# Patient Record
Sex: Female | Born: 1940
Health system: Southern US, Community
[De-identification: ages and names within clinical notes are randomized; demographics above are authoritative.]

## PROBLEM LIST (undated history)

## (undated) DIAGNOSIS — E7800 Pure hypercholesterolemia, unspecified: Secondary | ICD-10-CM

## (undated) DIAGNOSIS — I10 Essential (primary) hypertension: Secondary | ICD-10-CM

## (undated) DIAGNOSIS — E119 Type 2 diabetes mellitus without complications: Secondary | ICD-10-CM

## (undated) DIAGNOSIS — J45909 Unspecified asthma, uncomplicated: Secondary | ICD-10-CM

## (undated) DIAGNOSIS — G459 Transient cerebral ischemic attack, unspecified: Secondary | ICD-10-CM

## (undated) HISTORY — PX: ABDOMINAL HYSTERECTOMY: SHX81

## (undated) HISTORY — PX: CHOLECYSTECTOMY: SHX55

## (undated) HISTORY — PX: APPENDECTOMY: SHX54

---

## 2011-06-05 ENCOUNTER — Encounter (INDEPENDENT_AMBULATORY_CARE_PROVIDER_SITE_OTHER): Payer: Medicare Other | Admitting: Ophthalmology

## 2011-06-05 DIAGNOSIS — E11319 Type 2 diabetes mellitus with unspecified diabetic retinopathy without macular edema: Secondary | ICD-10-CM

## 2011-06-05 DIAGNOSIS — E1139 Type 2 diabetes mellitus with other diabetic ophthalmic complication: Secondary | ICD-10-CM

## 2011-06-05 DIAGNOSIS — H43819 Vitreous degeneration, unspecified eye: Secondary | ICD-10-CM

## 2011-06-05 DIAGNOSIS — H35039 Hypertensive retinopathy, unspecified eye: Secondary | ICD-10-CM

## 2011-06-05 DIAGNOSIS — H2510 Age-related nuclear cataract, unspecified eye: Secondary | ICD-10-CM

## 2011-06-05 DIAGNOSIS — I10 Essential (primary) hypertension: Secondary | ICD-10-CM

## 2011-08-07 ENCOUNTER — Encounter (INDEPENDENT_AMBULATORY_CARE_PROVIDER_SITE_OTHER): Payer: Medicare Other | Admitting: Ophthalmology

## 2011-08-07 DIAGNOSIS — H43819 Vitreous degeneration, unspecified eye: Secondary | ICD-10-CM

## 2011-08-07 DIAGNOSIS — H35039 Hypertensive retinopathy, unspecified eye: Secondary | ICD-10-CM

## 2011-08-07 DIAGNOSIS — E11319 Type 2 diabetes mellitus with unspecified diabetic retinopathy without macular edema: Secondary | ICD-10-CM

## 2011-08-07 DIAGNOSIS — H2510 Age-related nuclear cataract, unspecified eye: Secondary | ICD-10-CM

## 2011-08-07 DIAGNOSIS — E1165 Type 2 diabetes mellitus with hyperglycemia: Secondary | ICD-10-CM

## 2011-08-07 DIAGNOSIS — I10 Essential (primary) hypertension: Secondary | ICD-10-CM

## 2015-04-21 DIAGNOSIS — G459 Transient cerebral ischemic attack, unspecified: Secondary | ICD-10-CM

## 2015-04-21 HISTORY — DX: Transient cerebral ischemic attack, unspecified: G45.9

## 2015-12-17 DIAGNOSIS — Z79899 Other long term (current) drug therapy: Secondary | ICD-10-CM | POA: Insufficient documentation

## 2015-12-17 DIAGNOSIS — I10 Essential (primary) hypertension: Secondary | ICD-10-CM | POA: Insufficient documentation

## 2015-12-17 DIAGNOSIS — E7800 Pure hypercholesterolemia, unspecified: Secondary | ICD-10-CM | POA: Insufficient documentation

## 2016-03-06 DIAGNOSIS — F419 Anxiety disorder, unspecified: Secondary | ICD-10-CM | POA: Insufficient documentation

## 2016-03-06 DIAGNOSIS — F411 Generalized anxiety disorder: Secondary | ICD-10-CM | POA: Insufficient documentation

## 2016-03-06 DIAGNOSIS — R410 Disorientation, unspecified: Secondary | ICD-10-CM | POA: Insufficient documentation

## 2016-03-07 DIAGNOSIS — G459 Transient cerebral ischemic attack, unspecified: Secondary | ICD-10-CM | POA: Insufficient documentation

## 2016-03-07 DIAGNOSIS — D72829 Elevated white blood cell count, unspecified: Secondary | ICD-10-CM | POA: Insufficient documentation

## 2016-03-07 DIAGNOSIS — N390 Urinary tract infection, site not specified: Secondary | ICD-10-CM | POA: Insufficient documentation

## 2016-03-08 DIAGNOSIS — N183 Chronic kidney disease, stage 3 unspecified: Secondary | ICD-10-CM | POA: Insufficient documentation

## 2016-03-08 DIAGNOSIS — F0390 Unspecified dementia without behavioral disturbance: Secondary | ICD-10-CM | POA: Insufficient documentation

## 2016-03-20 DIAGNOSIS — R600 Localized edema: Secondary | ICD-10-CM | POA: Insufficient documentation

## 2016-11-10 DIAGNOSIS — Z0000 Encounter for general adult medical examination without abnormal findings: Secondary | ICD-10-CM | POA: Insufficient documentation

## 2016-12-08 DIAGNOSIS — G4700 Insomnia, unspecified: Secondary | ICD-10-CM | POA: Insufficient documentation

## 2016-12-16 DIAGNOSIS — K529 Noninfective gastroenteritis and colitis, unspecified: Secondary | ICD-10-CM | POA: Insufficient documentation

## 2016-12-16 DIAGNOSIS — K5730 Diverticulosis of large intestine without perforation or abscess without bleeding: Secondary | ICD-10-CM | POA: Insufficient documentation

## 2017-04-06 DIAGNOSIS — E559 Vitamin D deficiency, unspecified: Secondary | ICD-10-CM | POA: Insufficient documentation

## 2018-07-06 ENCOUNTER — Emergency Department (HOSPITAL_BASED_OUTPATIENT_CLINIC_OR_DEPARTMENT_OTHER): Payer: Medicare HMO

## 2018-07-06 ENCOUNTER — Emergency Department (HOSPITAL_BASED_OUTPATIENT_CLINIC_OR_DEPARTMENT_OTHER)
Admission: EM | Admit: 2018-07-06 | Discharge: 2018-07-06 | Disposition: A | Discharge status: Home / Self Care | Payer: Medicare HMO | Attending: Emergency Medicine | Admitting: Emergency Medicine

## 2018-07-06 ENCOUNTER — Other Ambulatory Visit: Payer: Self-pay

## 2018-07-06 ENCOUNTER — Encounter (HOSPITAL_BASED_OUTPATIENT_CLINIC_OR_DEPARTMENT_OTHER): Payer: Self-pay

## 2018-07-06 DIAGNOSIS — S0121XA Laceration without foreign body of nose, initial encounter: Secondary | ICD-10-CM | POA: Insufficient documentation

## 2018-07-06 DIAGNOSIS — Y929 Unspecified place or not applicable: Secondary | ICD-10-CM | POA: Insufficient documentation

## 2018-07-06 DIAGNOSIS — W19XXXA Unspecified fall, initial encounter: Secondary | ICD-10-CM

## 2018-07-06 DIAGNOSIS — S60221A Contusion of right hand, initial encounter: Secondary | ICD-10-CM | POA: Diagnosis not present

## 2018-07-06 DIAGNOSIS — Z79899 Other long term (current) drug therapy: Secondary | ICD-10-CM | POA: Diagnosis not present

## 2018-07-06 DIAGNOSIS — W108XXA Fall (on) (from) other stairs and steps, initial encounter: Secondary | ICD-10-CM | POA: Diagnosis not present

## 2018-07-06 DIAGNOSIS — I10 Essential (primary) hypertension: Secondary | ICD-10-CM | POA: Insufficient documentation

## 2018-07-06 DIAGNOSIS — Z7902 Long term (current) use of antithrombotics/antiplatelets: Secondary | ICD-10-CM | POA: Insufficient documentation

## 2018-07-06 DIAGNOSIS — S5011XA Contusion of right forearm, initial encounter: Secondary | ICD-10-CM

## 2018-07-06 DIAGNOSIS — E119 Type 2 diabetes mellitus without complications: Secondary | ICD-10-CM | POA: Insufficient documentation

## 2018-07-06 DIAGNOSIS — J45909 Unspecified asthma, uncomplicated: Secondary | ICD-10-CM | POA: Insufficient documentation

## 2018-07-06 DIAGNOSIS — S6992XA Unspecified injury of left wrist, hand and finger(s), initial encounter: Secondary | ICD-10-CM | POA: Diagnosis present

## 2018-07-06 DIAGNOSIS — Z8673 Personal history of transient ischemic attack (TIA), and cerebral infarction without residual deficits: Secondary | ICD-10-CM | POA: Insufficient documentation

## 2018-07-06 DIAGNOSIS — S60222A Contusion of left hand, initial encounter: Secondary | ICD-10-CM | POA: Insufficient documentation

## 2018-07-06 DIAGNOSIS — M25572 Pain in left ankle and joints of left foot: Secondary | ICD-10-CM | POA: Insufficient documentation

## 2018-07-06 DIAGNOSIS — Y9389 Activity, other specified: Secondary | ICD-10-CM | POA: Diagnosis not present

## 2018-07-06 DIAGNOSIS — Z7984 Long term (current) use of oral hypoglycemic drugs: Secondary | ICD-10-CM | POA: Diagnosis not present

## 2018-07-06 DIAGNOSIS — Y999 Unspecified external cause status: Secondary | ICD-10-CM | POA: Insufficient documentation

## 2018-07-06 HISTORY — DX: Transient cerebral ischemic attack, unspecified: G45.9

## 2018-07-06 HISTORY — DX: Type 2 diabetes mellitus without complications: E11.9

## 2018-07-06 HISTORY — DX: Unspecified asthma, uncomplicated: J45.909

## 2018-07-06 HISTORY — DX: Essential (primary) hypertension: I10

## 2018-07-06 HISTORY — DX: Pure hypercholesterolemia, unspecified: E78.00

## 2018-07-06 NOTE — Discharge Instructions (Addendum)
Take tylenol for pain control.  Use ice for pain and swelling.  You will likely have increased pain/stiffness/soreness for the next several days.  Follow up with your PCP as scheduled. Return to the ER if you develop severe headache, vision changes, vomiting, weakness/numbness, or any new, worsening, or concerning symptoms.

## 2018-07-06 NOTE — ED Triage Notes (Signed)
Pt was coming down the steps and missed the last step, stumbled down two steps, has bruising and swelling to left ankle, right wrist and states she hit her lower back on a clock as she fell. Occurred approximately 30 minutes ago. Pt is on plavix, did not hit head, but did happen to have a teapot from a shelf fall onto the bridge of her nose this morning, so she has bruising there as well.

## 2018-07-06 NOTE — ED Notes (Addendum)
States that she wants dr to check her head from a previous head injury pain at top of head left wrist swelling and left ankle swelling  Bruise to rt lower arm , she can walk on ankle but it hurts and is starting to feel tight. MAE with purpose, has good neuro no extra  numbnesss ,states has neuropathy, or tingling

## 2018-07-06 NOTE — ED Provider Notes (Signed)
[facility] EMERGENCY DEPARTMENT Provider Note   CSN: [id number] Arrival date & time: 07/06/18  1526    History   Chief Complaint Chief Complaint  Patient presents with   Fall    HPI Theresa Allen is a 78 y.o. female presenting for evaluation after fall.  Patient states just prior to arrival she was going down the stairs when she turned around to clean something off the rug, and then slipped on the last several stairs.  She reports hitting her left ankle, bilateral forearms, and back.  She denies hitting her head or loss of consciousness.  She is on Plavix.  Patient has been able to ambulate since, but reports worsening pain with ambulation, especially in her foot.  She has not taken anything for pain including Tylenol or ibuprofen.  Patient states earlier today, a teapot fell of her shelf and hit her on the bridge of her nose.  She denies other recent head trauma.  She denies vision changes, slurred speech, neck pain, numbness, weakness, or tingling.      HPI  Past Medical History:  Diagnosis Date   Asthma    Diabetes mellitus without complication (HCC)    High cholesterol    Hypertension    TIA (transient ischemic attack) 2017    There are no active problems to display for this patient.   Past Surgical History:  Procedure Laterality Date   ABDOMINAL HYSTERECTOMY     APPENDECTOMY     CHOLECYSTECTOMY       OB History   No obstetric history on file.      Home Medications    Prior to Admission medications   Medication Sig Start Date End Date Taking? Authorizing Provider  amLODipine (NORVASC) 10 MG tablet TAKE 1 TABLET(10 MG) BY MOUTH DAILY 05/05/16  Yes [provider]  clopidogrel (PLAVIX) 75 MG tablet Take by mouth. 12/08/16  Yes [provider]  donepezil (ARICEPT) 5 MG tablet Take by mouth. 12/08/16  Yes [provider]  gabapentin (NEURONTIN) 100 MG capsule Take 200 mg by mouth 3 (three) times daily.   Yes [provider]  glipiZIDE (GLUCOTROL XL) 10 MG 24 hr tablet TAKE 1 TABLET BY MOUTH TWICE DAILY 09/16/16  Yes [provider]  linagliptin (TRADJENTA) 5 MG TABS tablet Take 5 mg by mouth daily.   Yes [provider]  PARoxetine (PAXIL) 30 MG tablet TAKE 1 TABLET(30 MG) BY MOUTH EVERY MORNING 11/10/16  Yes [provider]  pioglitazone (ACTOS) 30 MG tablet Take by mouth. 12/22/17  Yes [provider]  simvastatin (ZOCOR) 20 MG tablet TAKE 1 TABLET(20 MG) BY MOUTH EVERY NIGHT 09/16/16  Yes [provider]  traZODone (DESYREL) 50 MG tablet Take by mouth. 12/08/16  Yes [provider]  Cholecalciferol (VITAMIN D-1000 MAX ST) 25 MCG (1000 UT) tablet Take by mouth.    [provider]    Family History No family history on file.  Social History Social History   Tobacco Use   Smoking status: Not on file  Substance Use Topics   Alcohol use: Not on file   Drug use: Not on file     Allergies   Metformin   Review of Systems Review of Systems  Musculoskeletal: Positive for arthralgias, back pain and joint swelling.  Hematological: Bruises/bleeds easily.  All other systems reviewed and are negative.    Physical Exam Updated Vital Signs BP (!) 127/55 (BP Location: Right Arm)    Pulse 77  Temp 97.9 F (36.6 C) (Oral)    Resp 16    Ht  (1.575 m)    Wt 90.7 kg    SpO2 97%    BMI 36.58 kg/m   Physical Exam Vitals signs and nursing note reviewed.  Constitutional:      General: She is not in acute distress.    Appearance: She is well-developed.  HENT:     Head: Normocephalic and atraumatic.     Comments: Small, superficial laceration over the bridge of the nose.  No injury noted elsewhere on the face or skull.  No hemotympanum or nasal septal hematoma. Eyes:     Extraocular Movements: Extraocular movements intact.     Conjunctiva/sclera: Conjunctivae normal.     Pupils: Pupils are equal, round, and reactive to light.      Comments: EOMI and PERRLA.  No entrapment.  Neck:     Musculoskeletal: Normal range of motion and neck supple.     Comments: No tenderness palpation of midline C-spine.  Moving head easily without signs of pain. Cardiovascular:     Rate and Rhythm: Normal rate and regular rhythm.     Pulses: Normal pulses.  Pulmonary:     Effort: Pulmonary effort is normal. No respiratory distress.     Breath sounds: Normal breath sounds. No wheezing.  Abdominal:     General: There is no distension.     Palpations: Abdomen is soft. There is no mass.     Tenderness: There is no abdominal tenderness. There is no guarding or rebound.  Musculoskeletal:        General: Swelling, tenderness and signs of injury present.     Comments: Contusion and abrasion of mid back over the midline.  No tenderness palpation elsewhere in the back.  No obvious step-offs.  Strength of upper and lower extremities intact bilaterally.  Sensation intact x4.  No saddle paresthesias. Hematoma of the left hand at the fourth MTP.  Good distal cap refill.  Full active range of motion of the wrist and fingers with minimal pain. Hematoma of the right forearm.  No obvious deformity.  Radial pulse intact.  Full active range of motion of the elbow and wrist without difficulty. Hematoma of the left lateral ankle just proximal to the lateral malleolus.  Pedal pulses intact.  Good distal cap refill.  Skin:    General: Skin is warm and dry.     Capillary Refill: Capillary refill takes less than 2 seconds.  Neurological:     Mental Status: She is alert and oriented to person, place, and time.      ED Treatments / Results  Labs (all labs ordered are listed, but only abnormal results are displayed) Labs Reviewed - No data to display  EKG None  Radiology Dg Thoracic Spine 2 View  Result Date: 07/06/2018 CLINICAL DATA:  Larey Seat today missing last few stairs, landed on mid back, pain at mid lower back and lumbar region, initial encounter  EXAM: THORACIC SPINE 2 VIEWS COMPARISON:  None FINDINGS: Twelve pairs of ribs. Bones demineralized. Mild scattered endplate spur formation at T9-T10 on RIGHT. Vertebral body heights maintained without fracture or subluxation. No bone destruction. IMPRESSION: No acute osseous abnormalities. Electronically Signed   By: Ulyses Southward M.D.   On: 07/06/2018 16:43   Dg Lumbar Spine 2-3 Views  Result Date: 07/06/2018 CLINICAL DATA:  Post fall now with mid and low back pain. EXAM: LUMBAR SPINE - 2-3 VIEW COMPARISON:  Thoracic spine radiographs-07/06/2018 FINDINGS:  There are 5 non rib-bearing lumbar type vertebral bodies. There is minimal (approximately 4 mm) of anterolisthesis of L4 upon L5. Lumbar vertebral body heights appear preserved. Mild to moderate multilevel lumbar spine DDD, worse at L2-L3 with disc space height loss, endplate irregularity and sclerosis. Limited visualization of the bilateral SI joints is normal. Atherosclerotic plaque within the abdominal aorta. Phleboliths overlie the lower pelvis bilaterally. IMPRESSION: 1. No acute findings. 2. Mild-to-moderate multilevel lumbar spine DDD, worse at L2-L3. Electronically Signed   By: Simonne Come M.D.   On: 07/06/2018 16:53   Dg Forearm Right  Result Date: 07/06/2018 CLINICAL DATA:  Fall, pain, bruising and RIGHT swelling where she struck her RIGHT posterior mid forearm EXAM: RIGHT FOREARM - 2 VIEW COMPARISON:  None FINDINGS: Dorsal soft tissue swelling at mid RIGHT forearm. Mild osseous demineralization. Joint spaces preserved. No acute fracture, dislocation, or bone destruction. IMPRESSION: Soft tissue swelling without acute osseous abnormalities. Electronically Signed   By: Ulyses Southward M.D.   On: 07/06/2018 16:42   Dg Ankle Complete Left  Result Date: 07/06/2018 CLINICAL DATA:  Larey Seat today injuring LEFT ankle, pain, swelling and bruising at lateral malleolus EXAM: LEFT ANKLE COMPLETE - 3+ VIEW COMPARISON:  None FINDINGS: Osseous demineralization.  Nonfused ossicle at tip of lateral malleolus, appears corticated and old. Soft tissue swelling laterally and anteriorly at distal LEFT lower leg into LEFT ankle. Joint spaces preserved. No acute fracture, dislocation, or bone destruction. IMPRESSION: No acute osseous abnormalities. Electronically Signed   By: Ulyses Southward M.D.   On: 07/06/2018 16:46   Ct Head Wo Contrast  Result Date: 07/06/2018 CLINICAL DATA:  Larey Seat down steps, hitting his head on a grandfather clock. EXAM: CT HEAD WITHOUT CONTRAST TECHNIQUE: Contiguous axial images were obtained from the base of the skull through the vertex without intravenous contrast. COMPARISON:  None. FINDINGS: Brain: Mild-to-moderate enlargement of the ventricles and subarachnoid spaces. Mild-to-moderate patchy white matter low density in both cerebral hemispheres. No intracranial hemorrhage, mass lesion or CT evidence of acute infarction. Vascular: No hyperdense vessel or unexpected calcification. Skull: Normal. Negative for fracture or focal lesion. Sinuses/Orbits: Status post bilateral cataract extraction. Unremarkable included paranasal sinuses. Other: None. IMPRESSION: 1. No skull fracture or intracranial hemorrhage. 2. Mild to moderate diffuse cerebral and cerebellar atrophy and mild-to-moderate chronic small vessel white matter ischemic changes in both cerebral hemispheres. Electronically Signed   By: Beckie Salts M.D.   On: 07/06/2018 16:19   Dg Hand Complete Left  Result Date: 07/06/2018 CLINICAL DATA:  Larey Seat today striking posterior LEFT hand, pain swelling and bruising at third through fifth metacarpal region EXAM: LEFT HAND - COMPLETE 3+ VIEW COMPARISON:  None FINDINGS: Osseous demineralization. Mild scattered joint space narrowing, greatest at first MCP joint. No acute fracture, dislocation, or bone destruction. Soft tissue swelling overlying the distal metacarpals and MCP joints on lateral view. IMPRESSION: No acute osseous abnormalities. Electronically  Signed   By: Ulyses Southward M.D.   On: 07/06/2018 16:45    Procedures Procedures (including critical care time)  Medications Ordered in ED Medications - No data to display   Initial Impression / Assessment and Plan / ED Course  I have reviewed the triage vital signs and the nursing notes.  Pertinent labs & imaging results that were available during my care of the patient were reviewed by me and considered in my medical decision making (see chart for details).        Patient presenting for evaluation after mechanical fall.  Physical exam  reassuring, she is neurovascularly intact.  However, patient at higher risk due to being on Plavix and due to her age.  Will obtain CT of the head, as well as imaging of the bilateral upper extremities, back, and left lower extremity.  X-rays viewed interpreted by me, no fractures or dislocations.  CT head negative for bleed or swelling.  Case discussed with attending, Dr. Jacqulyn Bath agrees to plan.  Discussed findings with patient.  Discussed Intermatic treatment with ice and Tylenol.  Patient has a appointment with her PCP next week already scheduled for a physical, encourage patient to follow-up as scheduled.  Strict return precautions given, including signs of head injury.  At this time, patient received a discharge.  Patient states she understands and agrees to plan.  Final Clinical Impressions(s) / ED Diagnoses   Final diagnoses:  Fall, initial encounter  Acute left ankle pain  Contusion of left hand, initial encounter  Contusion of right forearm, initial encounter    ED Discharge Orders    None       Alveria Apley, PA-C 07/06/18 1754    Long, Arlyss Repress, MD 07/07/18 [phone]

## 2018-08-24 DIAGNOSIS — H9313 Tinnitus, bilateral: Secondary | ICD-10-CM | POA: Insufficient documentation

## 2018-08-24 DIAGNOSIS — J302 Other seasonal allergic rhinitis: Secondary | ICD-10-CM | POA: Insufficient documentation

## 2018-09-03 DIAGNOSIS — M2041 Other hammer toe(s) (acquired), right foot: Secondary | ICD-10-CM | POA: Insufficient documentation

## 2020-01-03 DIAGNOSIS — E119 Type 2 diabetes mellitus without complications: Secondary | ICD-10-CM | POA: Insufficient documentation

## 2020-01-03 DIAGNOSIS — R0989 Other specified symptoms and signs involving the circulatory and respiratory systems: Secondary | ICD-10-CM | POA: Insufficient documentation

## 2020-06-12 ENCOUNTER — Emergency Department (HOSPITAL_COMMUNITY)
Admission: EM | Admit: 2020-06-12 | Discharge: 2020-06-13 | Disposition: A | Discharge status: Other Acute Inpatient Hospital | Payer: Medicare HMO | Attending: Emergency Medicine | Admitting: Emergency Medicine

## 2020-06-12 ENCOUNTER — Encounter (HOSPITAL_COMMUNITY): Payer: Self-pay

## 2020-06-12 ENCOUNTER — Other Ambulatory Visit: Payer: Self-pay

## 2020-06-12 ENCOUNTER — Emergency Department (HOSPITAL_COMMUNITY): Payer: Medicare HMO

## 2020-06-12 DIAGNOSIS — Z79899 Other long term (current) drug therapy: Secondary | ICD-10-CM | POA: Diagnosis not present

## 2020-06-12 DIAGNOSIS — I10 Essential (primary) hypertension: Secondary | ICD-10-CM | POA: Diagnosis not present

## 2020-06-12 DIAGNOSIS — F339 Major depressive disorder, recurrent, unspecified: Secondary | ICD-10-CM

## 2020-06-12 DIAGNOSIS — F332 Major depressive disorder, recurrent severe without psychotic features: Secondary | ICD-10-CM | POA: Insufficient documentation

## 2020-06-12 DIAGNOSIS — G479 Sleep disorder, unspecified: Secondary | ICD-10-CM | POA: Insufficient documentation

## 2020-06-12 DIAGNOSIS — Z20822 Contact with and (suspected) exposure to covid-19: Secondary | ICD-10-CM | POA: Diagnosis not present

## 2020-06-12 DIAGNOSIS — Z7902 Long term (current) use of antithrombotics/antiplatelets: Secondary | ICD-10-CM | POA: Diagnosis not present

## 2020-06-12 DIAGNOSIS — N390 Urinary tract infection, site not specified: Secondary | ICD-10-CM | POA: Insufficient documentation

## 2020-06-12 DIAGNOSIS — F4321 Adjustment disorder with depressed mood: Secondary | ICD-10-CM | POA: Insufficient documentation

## 2020-06-12 DIAGNOSIS — Z8673 Personal history of transient ischemic attack (TIA), and cerebral infarction without residual deficits: Secondary | ICD-10-CM | POA: Insufficient documentation

## 2020-06-12 DIAGNOSIS — Z7984 Long term (current) use of oral hypoglycemic drugs: Secondary | ICD-10-CM | POA: Diagnosis not present

## 2020-06-12 DIAGNOSIS — J45909 Unspecified asthma, uncomplicated: Secondary | ICD-10-CM | POA: Diagnosis not present

## 2020-06-12 DIAGNOSIS — R45851 Suicidal ideations: Secondary | ICD-10-CM | POA: Insufficient documentation

## 2020-06-12 DIAGNOSIS — E119 Type 2 diabetes mellitus without complications: Secondary | ICD-10-CM | POA: Diagnosis not present

## 2020-06-12 LAB — COMPREHENSIVE METABOLIC PANEL
ALT: 19 U/L (ref 0–44)
AST: 23 U/L (ref 15–41)
Albumin: 4.3 g/dL (ref 3.5–5.0)
Alkaline Phosphatase: 65 U/L (ref 38–126)
Anion gap: 11 (ref 5–15)
BUN: 20 mg/dL (ref 8–23)
CO2: 24 mmol/L (ref 22–32)
Calcium: 9 mg/dL (ref 8.9–10.3)
Chloride: 106 mmol/L (ref 98–111)
Creatinine, Ser: 1.29 mg/dL — ABNORMAL HIGH (ref 0.44–1.00)
GFR, Estimated: 42 mL/min — ABNORMAL LOW (ref >60)
Glucose, Bld: 164 mg/dL — ABNORMAL HIGH (ref 70–99)
Potassium: 3.6 mmol/L (ref 3.5–5.1)
Sodium: 141 mmol/L (ref 135–145)
Total Bilirubin: 0.7 mg/dL (ref 0.3–1.2)
Total Protein: 7.2 g/dL (ref 6.5–8.1)

## 2020-06-12 LAB — CBC WITH DIFFERENTIAL/PLATELET
Abs Immature Granulocytes: 0.02 10*3/uL (ref 0.00–0.07)
Basophils Absolute: 0.1 10*3/uL (ref 0.0–0.1)
Basophils Relative: 1 %
Eosinophils Absolute: 0.1 10*3/uL (ref 0.0–0.5)
Eosinophils Relative: 2 %
HCT: 44.6 % (ref 36.0–46.0)
Hemoglobin: 14.5 g/dL (ref 12.0–15.0)
Immature Granulocytes: 0 %
Lymphocytes Relative: 27 %
Lymphs Abs: 1.7 10*3/uL (ref 0.7–4.0)
MCH: 31.6 pg (ref 26.0–34.0)
MCHC: 32.5 g/dL (ref 30.0–36.0)
MCV: 97.2 fL (ref 80.0–100.0)
Monocytes Absolute: 0.6 10*3/uL (ref 0.1–1.0)
Monocytes Relative: 9 %
Neutro Abs: 3.9 10*3/uL (ref 1.7–7.7)
Neutrophils Relative %: 61 %
Platelets: 228 10*3/uL (ref 150–400)
RBC: 4.59 MIL/uL (ref 3.87–5.11)
RDW: 13.2 % (ref 11.5–15.5)
WBC: 6.4 10*3/uL (ref 4.0–10.5)
nRBC: 0 % (ref 0.0–0.2)

## 2020-06-12 LAB — ACETAMINOPHEN LEVEL: Acetaminophen (Tylenol), Serum: <10 ug/mL — ABNORMAL LOW (ref 10–30)

## 2020-06-12 LAB — URINALYSIS, ROUTINE W REFLEX MICROSCOPIC
Bilirubin Urine: NEGATIVE
Glucose, UA: 50 mg/dL — AB
Hgb urine dipstick: NEGATIVE
Ketones, ur: 5 mg/dL — AB
Nitrite: NEGATIVE
Protein, ur: 30 mg/dL — AB
Specific Gravity, Urine: 1.028 (ref 1.005–1.030)
WBC, UA: >50 WBC/hpf — ABNORMAL HIGH (ref 0–5)
pH: 5 (ref 5.0–8.0)

## 2020-06-12 LAB — CBG MONITORING, ED: Glucose-Capillary: 146 mg/dL — ABNORMAL HIGH (ref 70–99)

## 2020-06-12 LAB — LIPASE, BLOOD: Lipase: 34 U/L (ref 11–51)

## 2020-06-12 LAB — SALICYLATE LEVEL: Salicylate Lvl: <7 mg/dL — ABNORMAL LOW (ref 7.0–30.0)

## 2020-06-12 MED ORDER — SIMVASTATIN 20 MG PO TABS
20.0000 mg | ORAL_TABLET | Freq: Every day | ORAL | Status: DC
Start: 2020-06-13 — End: 2020-06-13

## 2020-06-12 MED ORDER — CEPHALEXIN 500 MG PO CAPS
500.0000 mg | ORAL_CAPSULE | Freq: Three times a day (TID) | ORAL | Status: DC
  Administered 2020-06-12 – 2020-06-13 (×3): 500 mg via ORAL
  Filled 2020-06-12 (×3): qty 1

## 2020-06-12 MED ORDER — GLIPIZIDE ER 10 MG PO TB24
10.0000 mg | ORAL_TABLET | Freq: Two times a day (BID) | ORAL | Status: DC
  Administered 2020-06-13: 10 mg via ORAL
  Filled 2020-06-12 (×2): qty 1

## 2020-06-12 MED ORDER — TRAZODONE HCL 50 MG PO TABS
50.0000 mg | ORAL_TABLET | Freq: Every day | ORAL | Status: DC
  Filled 2020-06-12: qty 1

## 2020-06-12 MED ORDER — ACETAMINOPHEN 325 MG PO TABS: 650.0000 mg | ORAL_TABLET | ORAL | Status: DC | PRN

## 2020-06-12 MED ORDER — CLOPIDOGREL BISULFATE 75 MG PO TABS
75.0000 mg | ORAL_TABLET | Freq: Every day | ORAL | Status: DC
  Administered 2020-06-13: 75 mg via ORAL
  Filled 2020-06-12: qty 1

## 2020-06-12 MED ORDER — AMLODIPINE BESYLATE 5 MG PO TABS
10.0000 mg | ORAL_TABLET | Freq: Every day | ORAL | Status: DC
  Administered 2020-06-13: 10 mg via ORAL
  Filled 2020-06-12: qty 2

## 2020-06-12 NOTE — ED Triage Notes (Signed)
Pt wants to be checked for a UTI versus a back injury, she's been having to help her husband physically lately Pt's daughters are requesting a psych eval because she's been taking xanax, talking about guns and hallucinating, not knowing facts

## 2020-06-12 NOTE — ED Provider Notes (Signed)
[facility]-EMERGENCY DEPT Provider Note   CSN: [id number] Arrival date & time: 06/12/20  2025     History No chief complaint on file.   Theresa Allen is a 80 y.o. female.  HPI      80 year old female with history of hypertension, hyperlipidemia, diabetes, asthma, TIA, sleep disorder which she was diagnosed with by Neurologist in [facility] who presents with family with concern for severe situational depression with suicidal ideation.  She has been caring for her husband who just had shoulder surgery, having to assist him with significant amount of care, helping lift him in order to go to the bathroom.  Reports that the stress of caring for him has resulted in severe anxiety and admits to depression and suicidal thoughts.  Family brought her here due to concerns regarding her safety.  She admits that when she was getting some medications for her husband, she found a gun in the house.  Reports that she held the gun in her hand and considered using it, but decided not to continue with her day.  Reports she returned to the area with a gun and another time and held it and put it back down.  She admits to having the suicidal thoughts, but reports that she feels better right now, and feels if she is able to rest with her daughter at her home, and her husband is able to be placed into a facility and she no longer has to care for him, that she will feel better.  She and family also report some "delusions" or hallucinations at night.  They report she has a history of sleep disorder which have been diagnosed by a neurologist in [facility], and that she is had intermittent episodes of this over the last 6 to 8 years.  They report with the significant stress that she is under now, it appears that is been getting worse.  She gives the example that her husband had said his daughter was going to come help, and although they did not make a plan for her to actually come help, the patient was  hearing her in the house at night.  Daughter reports that she believes she is having delusions.  Family reports when they looked back at prior neurology note 5 years ago, the symptoms were similar.  She reports she has disordered sleep where she will sometimes wake up crawling around on the floor, acting out dreams, and that she will have a hard time telling the difference between reality and dreaming in the middle of the night--and is not sure if she is having hallucinations.   Family and patient also report she has been taking xanax occasionally if she can get a hold of it, half a tablet to help her sleep at night.  There were events yesterday and today that led to family bringing her to the ED, involving her saying things like "I know where the guns is" and "when I'm gone you won't have to worry about it"  Medically, reports some back pain.  Regarding back pain, reports it is improved today as she has not had to lift him as much.  Denies trauma, falls, loss of control of bowel or bladder, fevers, numbness or weakness. Denies headaches, chest pain, shortness of breath, cough, difficulty walking or talking or other medical concerns.   Has 7 kids between she and husband of 33 years, and all of the children are here for support waiting in the car and present  Past Medical History:  Diagnosis Date   Asthma    Diabetes mellitus without complication (HCC)    High cholesterol    Hypertension    TIA (transient ischemic attack) 2017    There are no problems to display for this patient.   Past Surgical History:  Procedure Laterality Date   ABDOMINAL HYSTERECTOMY     APPENDECTOMY     CHOLECYSTECTOMY       OB History   No obstetric history on file.     History reviewed. No pertinent family history.  Social History   Tobacco Use   Smoking status: Never Smoker   Smokeless tobacco: Never Used  Substance Use Topics   Alcohol use: Never   Drug use: Never    Home  Medications Prior to Admission medications   Medication Sig Start Date End Date Taking? Authorizing Provider  amLODipine (NORVASC) 10 MG tablet TAKE 1 TABLET(10 MG) BY MOUTH DAILY 05/05/16  Yes [provider]  Cholecalciferol 25 MCG (1000 UT) tablet Take 2,000 Units by mouth 2 (two) times daily.   Yes [provider]  clopidogrel (PLAVIX) 75 MG tablet Take by mouth. 12/08/16  Yes [provider]  glipiZIDE (GLUCOTROL XL) 10 MG 24 hr tablet TAKE 1 TABLET BY MOUTH TWICE DAILY 09/16/16  Yes [provider]  donepezil (ARICEPT) 5 MG tablet Take by mouth. 12/08/16   [provider]  gabapentin (NEURONTIN) 100 MG capsule Take 200 mg by mouth 3 (three) times daily.    [provider]  linagliptin (TRADJENTA) 5 MG TABS tablet Take 5 mg by mouth daily.    [provider]  PARoxetine (PAXIL) 30 MG tablet TAKE 1 TABLET(30 MG) BY MOUTH EVERY MORNING 11/10/16   [provider]  pioglitazone (ACTOS) 30 MG tablet Take by mouth. 12/22/17   [provider]  simvastatin (ZOCOR) 20 MG tablet TAKE 1 TABLET(20 MG) BY MOUTH EVERY NIGHT 09/16/16   [provider]  traZODone (DESYREL) 50 MG tablet Take by mouth. 12/08/16   [provider]    Allergies    Metformin  Review of Systems   Review of Systems  Constitutional: Negative for fever.  HENT: Negative for sore throat.   Eyes: Negative for visual disturbance.  Respiratory: Negative for cough and shortness of breath.   Cardiovascular: Negative for chest pain.  Gastrointestinal: Negative for abdominal pain, nausea and vomiting.  Genitourinary: Negative for difficulty urinating.  Musculoskeletal: Negative for back pain and neck pain.  Skin: Negative for rash.  Neurological: Negative for syncope and headaches.  Psychiatric/Behavioral: Positive for hallucinations (sleep disorder).    Physical Exam Updated Vital Signs BP (!) 164/76    Pulse 79    Temp 97.9 F (36.6 C)     Resp 14    SpO2 92%   Physical Exam Vitals and nursing note reviewed.  Constitutional:      General: She is not in acute distress.    Appearance: She is well-developed and well-nourished. She is not diaphoretic.  HENT:     Head: Normocephalic and atraumatic.  Eyes:     Extraocular Movements: EOM normal.     Conjunctiva/sclera: Conjunctivae normal.  Cardiovascular:     Rate and Rhythm: Normal rate and regular rhythm.     Pulses: Intact distal pulses.     Heart sounds: Normal heart sounds. No murmur heard. No friction rub. No gallop.   Pulmonary:     Effort: Pulmonary effort is normal. No respiratory distress.  Breath sounds: Normal breath sounds. No wheezing or rales.  Abdominal:     General: There is no distension.     Palpations: Abdomen is soft.     Tenderness: There is no abdominal tenderness. There is no guarding.  Musculoskeletal:        General: No tenderness or edema.     Cervical back: Normal range of motion.  Skin:    General: Skin is warm and dry.     Findings: No erythema or rash.  Neurological:     Mental Status: She is alert and oriented to person, place, and time.     Comments: Normal strength and sensation lower extremities (neuropathy but no asymmetry/normal for pt distally, normal sensation proximally)     ED Results / Procedures / Treatments   Labs (all labs ordered are listed, but only abnormal results are displayed) Labs Reviewed  COMPREHENSIVE METABOLIC PANEL - Abnormal; Notable for the following components:      Result Value   Glucose, Bld 164 (*)    Creatinine, Ser 1.29 (*)    GFR, Estimated 42 (*)    All other components within normal limits  URINALYSIS, ROUTINE W REFLEX MICROSCOPIC - Abnormal; Notable for the following components:   Glucose, UA 50 (*)    Ketones, ur 5 (*)    Protein, ur 30 (*)    Leukocytes,Ua MODERATE (*)    WBC, UA >50 (*)    Bacteria, UA RARE (*)    All other components within normal limits  ACETAMINOPHEN LEVEL -  Abnormal; Notable for the following components:   Acetaminophen (Tylenol), Serum <10 (*)    All other components within normal limits  SALICYLATE LEVEL - Abnormal; Notable for the following components:   Salicylate Lvl <7.0 (*)    All other components within normal limits  CBG MONITORING, ED - Abnormal; Notable for the following components:   Glucose-Capillary 146 (*)    All other components within normal limits  URINE CULTURE  RESP PANEL BY RT-PCR (FLU A&B, COVID) ARPGX2  CBC WITH DIFFERENTIAL/PLATELET  LIPASE, BLOOD    EKG EKG Interpretation  Date/Time:  Wednesday June 12 2020 21:25:21 EST Ventricular Rate:  68 PR Interval:    QRS Duration: 90 QT Interval:  420 QTC Calculation: 447 R Axis:   -24 Text Interpretation: Sinus rhythm Borderline left axis deviation Low voltage, precordial leads No previous ECGs available Confirmed by Alvira Monday ([id number]) on 06/12/2020 11:24:11 PM   Radiology No results found.  Procedures Procedures   Medications Ordered in ED Medications  cephALEXin (KEFLEX) capsule 500 mg (500 mg Oral Given 06/12/20 2319)  amLODipine (NORVASC) tablet 10 mg (has no administration in time range)  clopidogrel (PLAVIX) tablet 75 mg (has no administration in time range)  traZODone (DESYREL) tablet 50 mg (has no administration in time range)  simvastatin (ZOCOR) tablet 20 mg (has no administration in time range)  glipiZIDE (GLUCOTROL XL) 24 hr tablet 10 mg (has no administration in time range)  acetaminophen (TYLENOL) tablet 650 mg (has no administration in time range)    ED Course  I have reviewed the triage vital signs and the nursing notes.  Pertinent labs & imaging results that were available during my care of the patient were reviewed by me and considered in my medical decision making (see chart for details).    MDM Rules/Calculators/A&P  80 year old female with history of hypertension, hyperlipidemia, diabetes, asthma,  TIA, sleep disorder which she was diagnosed with by Neurologist in [facility] who presents with family with concern for severe situational depression in setting of caring for her husband with suicidal ideation with access to a gun.  She and family also report increase in nighttime delusions, which they report she has had int he past and diagnosed as sleep disorder but have been more frequent. Suspect increased frequency is in the setting of stress.  Urine with possible UTI given WBCs but also does not show bacteria. Feel coverage with abx appropriate for possible UTI. No sign of sepsis.  No other focal neurologic findings, no headaches, no trauma, normal mentation at this time, and history of same, do not feel she requires further medical work up regarding this.  Creatinine at baseline.    She is medically cleared. Home medications ordered.  TTS consult placed.  She is voluntary at this time, but do feel if she were to change her mind and want to leave prior to TTS evaluation she should be IVCd given her SI and access to a gun.  Has a supportive family and good insight at this time.    Final Clinical Impression(s) / ED Diagnoses Final diagnoses:  Situational depression  Suicidal ideation  Sleep disorder  Urinary tract infection without hematuria, site unspecified    Rx / DC Orders ED Discharge Orders    None       Alvira Monday, MD 06/13/20 0001

## 2020-06-13 ENCOUNTER — Emergency Department (HOSPITAL_COMMUNITY): Payer: Medicare HMO

## 2020-06-13 DIAGNOSIS — F339 Major depressive disorder, recurrent, unspecified: Secondary | ICD-10-CM

## 2020-06-13 DIAGNOSIS — F332 Major depressive disorder, recurrent severe without psychotic features: Secondary | ICD-10-CM | POA: Insufficient documentation

## 2020-06-13 LAB — RESP PANEL BY RT-PCR (FLU A&B, COVID) ARPGX2
Influenza A by PCR: NEGATIVE
Influenza B by PCR: NEGATIVE
SARS Coronavirus 2 by RT PCR: NEGATIVE

## 2020-06-13 LAB — RAPID URINE DRUG SCREEN, HOSP PERFORMED
Amphetamines: NOT DETECTED
Barbiturates: NOT DETECTED
Benzodiazepines: POSITIVE — AB
Cocaine: NOT DETECTED
Opiates: NOT DETECTED
Tetrahydrocannabinol: NOT DETECTED

## 2020-06-13 LAB — CBG MONITORING, ED: Glucose-Capillary: 128 mg/dL — ABNORMAL HIGH (ref 70–99)

## 2020-06-13 MED ORDER — LOPERAMIDE HCL 2 MG PO CAPS
2.0000 mg | ORAL_CAPSULE | Freq: Once | ORAL | Status: AC
  Administered 2020-06-13: 2 mg via ORAL
  Filled 2020-06-13: qty 1

## 2020-06-13 MED ORDER — PAROXETINE HCL 20 MG PO TABS
30.0000 mg | ORAL_TABLET | Freq: Every day | ORAL | Status: DC
  Administered 2020-06-13: 30 mg via ORAL
  Filled 2020-06-13: qty 1

## 2020-06-13 MED ORDER — LORAZEPAM 0.5 MG PO TABS
0.5000 mg | ORAL_TABLET | Freq: Once | ORAL | Status: AC
  Administered 2020-06-13: 0.5 mg via ORAL
  Filled 2020-06-13: qty 1

## 2020-06-13 NOTE — BH Assessment (Signed)
BHH Assessment Progress Note  Per Berneice Heinrich, NP, this pt requires psychiatric hospitalization at this time.  At 14:04 this Clinical research associate spoke to [place] at [facility].  Pt has been accepted to their facility by Dr Seth Bake.  Inetta Fermo concurs with this disposition, as does the pt who is currently under voluntary status.  At [facility] request I had pt sign a [facility] consent for admission form and sent it to [place].  She has confirmed receipt.  EDP Lynden Oxford, MD and pt's nurse, Addison Naegeli, have been notified, and Addison Naegeli agrees to call report to [phone] and to send original consent form with pt.  Pt is to be transported via [employer].  Doylene Canning, [place] Behavioral Health Coordinator [phone]

## 2020-06-13 NOTE — Consult Note (Signed)
Telepsych Consultation   Reason for Consult:  Psychiatry provider reassessment Referring Physician:  Dr Rush Landmarkegeler Location of Patient: [facility] emergency department Location of Provider: Behavioral Health TTS Department  Patient Identification: Theresa RidgelGale Allen MRN:  [id number] Principal Diagnosis: Depression, major, recurrent (HCC) Diagnosis:  Principal Problem:   Depression, major, recurrent (HCC)   Total Time spent with patient: 30 minutes  Subjective:   Theresa Allen is a 80 y.o. female patient.  Patient states "I let myself get depressed, I did not ask for help."  Patient reports "me and my husband are getting old and it is very hard."  HPI:   Patient assessed by nurse practitioner.  Patient alert and oriented, answers appropriately.  Patient pleasant and cooperative during assessment.  Patient denies suicidal ideations currently however endorses fleeting suicidal thoughts "off and on for a while."  Patient denies any history of suicide attempts, patient denies any history of self-harm behaviors.  Patient contracts verbally for safety with this Clinical research associatewriter.  Patient reports "I am strong in my faith, suicide is a sin and I do not think I would hurt myself."  Patient reports recent stressors include her 80 year old husband who had a fall and subsequent shoulder surgery.  Patient reports she has been more involved in caring for her husband related to his recent surgery.  Patient also reports stress surrounding family dynamic.  Patient reports she and her husband have "two blended families who sometimes clash."  Patient reports she has difficulty sleeping for many years.  Patient reports she has been taking Xanax prescribed to her husband as she believes this is the only medication that helps her sleep.  Patient reports she has tried trazodone in the past and this medication is not effective for her.  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  Patient denies any outpatient psychiatry follow-up.  Patient reports she was treated for anxiety approximately 4 years ago.  Patient is prescribed Paxil by primary care, reports compliance with Paxil 30 mg daily.  Patient denies homicidal ideations.  Patient denies both auditory and visual hallucinations.  Patient does endorse vivid dreams that "sometimes seem real or I do not know the difference."  Patient reports she has experienced similar dreams since she was a child.  Patient denies symptoms of paranoia.  Patient [place] in [place].  Patient is retired.  Patient denies alcohol and substance use.  Patient endorses average appetite and decreased sleep.  Patient offered support and encouragement.    Past Psychiatric History: Depression  Risk to Self:  Denies Risk to Others:  Denies Prior Inpatient Therapy:  Denies Prior Outpatient Therapy:  Denies any recent psychiatry follow-up  Past Medical History:  Past Medical History:  Diagnosis Date  . Asthma   . Diabetes mellitus without complication (HCC)   . High cholesterol   . Hypertension   . TIA (transient ischemic attack) 2017    Past Surgical History:  Procedure Laterality Date  . ABDOMINAL HYSTERECTOMY    . APPENDECTOMY    . CHOLECYSTECTOMY     Family History: History reviewed. No pertinent family history. Family Psychiatric  History: None reported Social History:  Social History   Substance and Sexual Activity  Alcohol Use Never     Social History   Substance and Sexual Activity  Drug Use Never    Social History   Socioeconomic History  . Marital  status: Married    Spouse name: Not on file  . Number of children: Not on file  . Years of education: Not on file  . Highest education level: Not on file  Occupational History  . Not on file  Tobacco Use  . Smoking status: Never Smoker  . Smokeless tobacco: Never  Used  Substance and Sexual Activity  . Alcohol use: Never  . Drug use: Never  . Sexual activity: Not on file  Other Topics Concern  . Not on file  Social History Narrative  . Not on file   Social Determinants of Health   Financial Resource Strain: Not on file  Food Insecurity: Not on file  Transportation Needs: Not on file  Physical Activity: Not on file  Stress: Not on file  Social Connections: Not on file   Additional Social History:    Allergies:   Allergies  Allergen Reactions  . Metformin Diarrhea    Labs:  Results for orders placed or performed during the hospital encounter of 06/12/20 (from the past 48 hour(s))  CBG monitoring, ED     Status: Abnormal   Collection Time: 06/12/20  8:37 PM  Result Value Ref Range   Glucose-Capillary 146 (H) 70 - 99 mg/dL    Comment: Glucose reference range applies only to samples taken after fasting for at least 8 hours.  CBC with Differential     Status: None   Collection Time: 06/12/20  8:51 PM  Result Value Ref Range   WBC 6.4 4.0 - 10.5 K/uL   RBC 4.59 3.87 - 5.11 MIL/uL   Hemoglobin 14.5 12.0 - 15.0 g/dL   HCT 87.5 64.3 - 32.9 %   MCV 97.2 80.0 - 100.0 fL   MCH 31.6 26.0 - 34.0 pg   MCHC 32.5 30.0 - 36.0 g/dL   RDW 51.8 84.1 - 66.0 %   Platelets 228 150 - 400 K/uL   nRBC 0.0 0.0 - 0.2 %   Neutrophils Relative % 61 %   Neutro Abs 3.9 1.7 - 7.7 K/uL   Lymphocytes Relative 27 %   Lymphs Abs 1.7 0.7 - 4.0 K/uL   Monocytes Relative 9 %   Monocytes Absolute 0.6 0.1 - 1.0 K/uL   Eosinophils Relative 2 %   Eosinophils Absolute 0.1 0.0 - 0.5 K/uL   Basophils Relative 1 %   Basophils Absolute 0.1 0.0 - 0.1 K/uL   Immature Granulocytes 0 %   Abs Immature Granulocytes 0.02 0.00 - 0.07 K/uL    Comment: Performed at [facility], [address] [place], [place] [address]  Comprehensive metabolic panel     Status: Abnormal   Collection Time: 06/12/20  8:51 PM  Result Value Ref Range   Sodium 141 135 - 145  mmol/L   Potassium 3.6 3.5 - 5.1 mmol/L   Chloride 106 98 - 111 mmol/L   CO2 24 22 - 32 mmol/L   Glucose, Bld 164 (H) 70 - 99 mg/dL    Comment: Glucose reference range applies only to samples taken after fasting for at least 8 hours.   BUN 20 8 - 23 mg/dL   Creatinine, Ser 0.10 (H) 0.44 - 1.00 mg/dL   Calcium 9.0 8.9 - 93.2 mg/dL   Total Protein 7.2 6.5 - 8.1 g/dL   Albumin 4.3 3.5 - 5.0 g/dL   AST 23 15 - 41 U/L   ALT 19 0 - 44 U/L   Alkaline Phosphatase 65 38 - 126 U/L  Total Bilirubin 0.7 0.3 - 1.2 mg/dL   GFR, Estimated 42 (L) >60 mL/min    Comment: (NOTE) Calculated using the CKD-EPI Creatinine Equation (2021)    Anion gap 11 5 - 15    Comment: Performed at [facility], [address] [place], [place] [address]  Lipase, blood     Status: None   Collection Time: 06/12/20  8:51 PM  Result Value Ref Range   Lipase 34 11 - 51 U/L    Comment: Performed at [facility], [address] [place], [place] [address]  Urinalysis, Routine w reflex microscopic Urine, Clean Catch     Status: Abnormal   Collection Time: 06/12/20  8:56 PM  Result Value Ref Range   Color, Urine YELLOW YELLOW   APPearance CLEAR CLEAR   Specific Gravity, Urine 1.028 1.005 - 1.030   pH 5.0 5.0 - 8.0   Glucose, UA 50 (A) NEGATIVE mg/dL   Hgb urine dipstick NEGATIVE NEGATIVE   Bilirubin Urine NEGATIVE NEGATIVE   Ketones, ur 5 (A) NEGATIVE mg/dL   Protein, ur 30 (A) NEGATIVE mg/dL   Nitrite NEGATIVE NEGATIVE   Leukocytes,Ua MODERATE (A) NEGATIVE   RBC / HPF 0-5 0 - 5 RBC/hpf   WBC, UA >50 (H) 0 - 5 WBC/hpf   Bacteria, UA RARE (A) NONE SEEN   Squamous Epithelial / LPF 0-5 0 - 5   Mucus PRESENT    Ca Oxalate Crys, UA PRESENT     Comment: Performed at [facility], [address] [place], [place] [address]  Acetaminophen level     Status: Abnormal   Collection Time: 06/12/20 10:41 PM  Result Value Ref Range   Acetaminophen (Tylenol), Serum <10  (L) 10 - 30 ug/mL    Comment: (NOTE) Therapeutic concentrations vary significantly. A range of 10-30 ug/mL  may be an effective concentration for many patients. However, some  are best treated at concentrations outside of this range. Acetaminophen concentrations >150 ug/mL at 4 hours after ingestion  and >50 ug/mL at 12 hours after ingestion are often associated with  toxic reactions.  Performed at [facility], [address], [place], [place] [address]   Salicylate level     Status: Abnormal   Collection Time: 06/12/20 10:41 PM  Result Value Ref Range   Salicylate Lvl <7.0 (L) 7.0 - 30.0 mg/dL    Comment: Performed at [facility], [address] [place], [place] [address]  Resp Panel by RT-PCR (Flu A&B, Covid) Nasopharyngeal Swab     Status: None   Collection Time: 06/12/20 11:49 PM   Specimen: Nasopharyngeal Swab; Nasopharyngeal(NP) swabs in vial transport medium  Result Value Ref Range   SARS Coronavirus 2 by RT PCR NEGATIVE NEGATIVE    Comment: (NOTE) SARS-CoV-2 target nucleic acids are NOT DETECTED.  The SARS-CoV-2 RNA is generally detectable in upper respiratory specimens during the acute phase of infection. The lowest concentration of SARS-CoV-2 viral copies this assay can detect is 138 copies/mL. A negative result does not preclude SARS-Cov-2 infection and should not be used as the sole basis for treatment or other patient management decisions. A negative result may occur with  improper specimen collection/handling, submission of specimen other than nasopharyngeal swab, presence of viral mutation(s) within the areas targeted by this assay, and inadequate number of viral copies(<138 copies/mL). A negative result must be combined with clinical observations, patient history, and epidemiological information. The expected result is Negative.  Fact Sheet  for Patients:  BloggerCourse.com  Fact Sheet for  Healthcare Providers:  SeriousBroker.it  This test is no t yet approved or cleared by the [place] FDA and  has been authorized for detection and/or diagnosis of SARS-CoV-2 by FDA under an Emergency Use Authorization (EUA). This EUA will remain  in effect (meaning this test can be used) for the duration of the COVID-19 declaration under Section 564(b)(1) of the Act, 21 U.S.C.section 360bbb-3(b)(1), unless the authorization is terminated  or revoked sooner.       Influenza A by PCR NEGATIVE NEGATIVE   Influenza B by PCR NEGATIVE NEGATIVE    Comment: (NOTE) The Xpert Xpress SARS-CoV-2/FLU/RSV plus assay is intended as an aid in the diagnosis of influenza from Nasopharyngeal swab specimens and should not be used as a sole basis for treatment. Nasal washings and aspirates are unacceptable for Xpert Xpress SARS-CoV-2/FLU/RSV testing.  Fact Sheet for Patients: BloggerCourse.com  Fact Sheet for Healthcare Providers: SeriousBroker.it  This test is not yet approved or cleared by the [place] FDA and has been authorized for detection and/or diagnosis of SARS-CoV-2 by FDA under an Emergency Use Authorization (EUA). This EUA will remain in effect (meaning this test can be used) for the duration of the COVID-19 declaration under Section 564(b)(1) of the Act, 21 U.S.C. section 360bbb-3(b)(1), unless the authorization is terminated or revoked.  Performed at [facility], [address] [place], [place] [address]   CBG monitoring, ED     Status: Abnormal   Collection Time: 06/13/20  8:36 AM  Result Value Ref Range   Glucose-Capillary 128 (H) 70 - 99 mg/dL    Comment: Glucose reference range applies only to samples taken after fasting for at least 8 hours.    Medications:  Current Facility-Administered Medications  Medication Dose Route Frequency Provider Last Rate Last Admin  .  acetaminophen (TYLENOL) tablet 650 mg  650 mg Oral Q4H PRN Alvira Monday, MD      . amLODipine (NORVASC) tablet 10 mg  10 mg Oral Daily Alvira Monday, MD   10 mg at 06/13/20 0900  . cephALEXin (KEFLEX) capsule 500 mg  500 mg Oral Q8H Alvira Monday, MD   500 mg at 06/13/20 6834  . clopidogrel (PLAVIX) tablet 75 mg  75 mg Oral Daily Alvira Monday, MD   75 mg at 06/13/20 0900  . glipiZIDE (GLUCOTROL XL) 24 hr tablet 10 mg  10 mg Oral BID Alvira Monday, MD   10 mg at 06/13/20 0900  . simvastatin (ZOCOR) tablet 20 mg  20 mg Oral q1800 Alvira Monday, MD      . traZODone (DESYREL) tablet 50 mg  50 mg Oral QHS Alvira Monday, MD       Current Outpatient Medications  Medication Sig Dispense Refill  . amLODipine (NORVASC) 10 MG tablet TAKE 1 TABLET(10 MG) BY MOUTH DAILY    . cetirizine (ZYRTEC) 10 MG tablet Take 10 mg by mouth daily.    . Cholecalciferol 25 MCG (1000 UT) tablet Take 2,000 Units by mouth 2 (two) times daily.    . clopidogrel (PLAVIX) 75 MG tablet Take by mouth.    . donepezil (ARICEPT) 5 MG tablet Take by mouth.    Marland Kitchen glipiZIDE (GLUCOTROL XL) 10 MG 24 hr tablet TAKE 1 TABLET BY MOUTH TWICE DAILY    . PARoxetine (PAXIL) 30 MG tablet TAKE 1 TABLET(30 MG) BY MOUTH EVERY MORNING    . pioglitazone (ACTOS) 30 MG tablet Take 15 mg by mouth daily.    Marland Kitchen  simvastatin (ZOCOR) 20 MG tablet TAKE 1 TABLET(20 MG) BY MOUTH EVERY NIGHT      Musculoskeletal: Strength & Muscle Tone: within normal limits Gait & Station: normal Patient leans: N/A  Psychiatric Specialty Exam: Physical Exam Vitals and nursing note reviewed.  Constitutional:      Appearance: She is well-developed.  HENT:     Head: Normocephalic.  Cardiovascular:     Rate and Rhythm: Normal rate.  Pulmonary:     Effort: Pulmonary effort is normal.  Neurological:     Mental Status: She is alert and oriented to person, place, and time.  Psychiatric:        Attention and Perception: Attention and perception  normal.        Mood and Affect: Mood is depressed. Affect is tearful.        Speech: Speech normal.        Behavior: Behavior normal. Behavior is cooperative.        Thought Content: Thought content normal.        Cognition and Memory: Cognition and memory normal.        Judgment: Judgment normal.     Review of Systems  Constitutional: Negative.   HENT: Negative.   Eyes: Negative.   Respiratory: Negative.   Cardiovascular: Negative.   Gastrointestinal: Negative.   Genitourinary: Negative.   Musculoskeletal: Negative.   Skin: Negative.   Neurological: Negative.   Psychiatric/Behavioral: Positive for sleep disturbance.    Blood pressure 140/79, pulse 75, temperature 98.6 F (37 C), resp. rate 16, SpO2 93 %.There is no height or weight on file to calculate BMI.  General Appearance: Casual and Fairly Groomed  Eye Contact:  Good  Speech:  Clear and Coherent and Normal Rate  Volume:  Normal  Mood:  Depressed  Affect:  Congruent and Depressed  Thought Process:  Coherent, Goal Directed and Descriptions of Associations: Intact  Orientation:  Full (Time, Place, and Person)  Thought Content:  Logical  Suicidal Thoughts:  No  Homicidal Thoughts:  No  Memory:  Immediate;   Good Recent;   Good Remote;   Good  Judgement:  Good  Insight:  Good  Psychomotor Activity:  Normal  Concentration:  Attention Span: Good  Recall:  Good  Fund of Knowledge:  Good  Language:  Good  Akathisia:  No  Handed:  Right  AIMS (if indicated):     Assets:  Communication Skills Desire for Improvement Financial Resources/Insurance Housing Intimacy Leisure Time Resilience Social Support Talents/Skills Transportation  ADL's:  Intact  Cognition:  WNL  Sleep:        Treatment Plan Summary: Patient reviewed with Dr. Bronwen Betters. Restarted home medications including: -Paxil  daily   Disposition: Recommend psychiatric Inpatient admission when medically cleared. Supportive therapy provided  about ongoing stressors.  This service was provided via telemedicine using a 2-way, interactive audio and video technology.  Names of all persons participating in this telemedicine service and their role in this encounter. Name: Theresa Allen Role: Patient  Name: Berneice Heinrich Role: FNP  Name: Dr. Bronwen Betters Role: Psychiatrist    Patrcia Dolly, FNP 06/13/2020 10:14 AM

## 2020-06-13 NOTE — Progress Notes (Signed)
06/13/2020  1420  Called [facility] [phone] to take patient to [facility].

## 2020-06-13 NOTE — Progress Notes (Signed)
06/13/2020  1425  Called report to [facility] [phone]. Report given to [facility].
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
## 2020-06-13 NOTE — BH Assessment (Signed)
BHH Assessment Progress Note  Per Theresa Heinrich, NP, this pt requires psychiatric hospitalization at this time, preferably at a facility providing specialty services for geriatric patients.  The following facilities have been contacted to seek placement for this pt, with results as noted:  Beds available, information sent, decision pending: Beckie Busing [facility]   At capacity: [facility]   If this voluntary pt is accepted to a facility, please discuss disposition with pt to be sure that seh agrees to the plan.  If a facility agrees to accept pt and the plan changes in any way please call the facility to inform them of the change.  Final disposition is pending as of this writing.  Doylene Canning, [place] [facility] Coordinator [phone]

## 2020-06-13 NOTE — ED Provider Notes (Addendum)
Emergency Medicine Observation Re-evaluation Note  Theresa Allen is a 80 y.o. female, seen on rounds today.  Pt initially presented to the ED for complaints of No chief complaint on file. Currently, the patient is awaiting placement.  Physical Exam  BP 140/79 (BP Location: Left Arm)   Pulse 75   Temp 98.6 F (37 C)   Resp 16   SpO2 93%  Physical Exam General: Just a breakfast and is resting comfortably Cardiac: No murmur on initial exam Lungs: Clear Psych: Resting comfortably and calm  ED Course / MDM  EKG:EKG Interpretation  Date/Time:  Wednesday June 12 2020 21:25:21 EST Ventricular Rate:  68 PR Interval:    QRS Duration: 90 QT Interval:  420 QTC Calculation: 447 R Axis:   -24 Text Interpretation: Sinus rhythm Borderline left axis deviation Low voltage, precordial leads No previous ECGs available Confirmed by Alvira Monday ([id number]) on 06/12/2020 11:24:11 PM    I have reviewed the labs performed to date as well as medications administered while in observation.  Recent changes in the last 24 hours include none reported.   Plan  Current plan is for [facility] psych placement.  Social work requests a chest x-ray before she can be placed and this was ordered.  We will follow up on the results..  11:03 AM Chest x-ray shows scarring but lungs are otherwise clear.  No evidence of pneumonia still awaiting placement  Patient is not under full IVC at this time but documentation shows that if patient tries to leave, she would likely need to be IVC.    Tegeler, Canary Brim, MD 06/13/20 1104  2:53 PM Was made aware the patient is been accepted to [facility] under care of Dr Seth Bake.  [facility] documentation will be filled out and patient will be transferred.       Tegeler, Canary Brim, MD 06/13/20 [phone]

## 2020-06-14 DIAGNOSIS — E119 Type 2 diabetes mellitus without complications: Secondary | ICD-10-CM | POA: Insufficient documentation

## 2020-06-14 LAB — URINE CULTURE: Culture: <10000 — AB

## 2020-06-20 ENCOUNTER — Other Ambulatory Visit: Payer: Self-pay

## 2020-06-20 ENCOUNTER — Emergency Department (HOSPITAL_COMMUNITY): Payer: Medicare HMO

## 2020-06-20 ENCOUNTER — Emergency Department (HOSPITAL_COMMUNITY)
Admission: EM | Admit: 2020-06-20 | Discharge: 2020-06-20 | Disposition: A | Discharge status: Home / Self Care | Payer: Medicare HMO | Attending: Emergency Medicine | Admitting: Emergency Medicine

## 2020-06-20 ENCOUNTER — Encounter (HOSPITAL_COMMUNITY): Payer: Self-pay

## 2020-06-20 DIAGNOSIS — R110 Nausea: Secondary | ICD-10-CM | POA: Diagnosis not present

## 2020-06-20 DIAGNOSIS — M25532 Pain in left wrist: Secondary | ICD-10-CM | POA: Diagnosis not present

## 2020-06-20 DIAGNOSIS — M25552 Pain in left hip: Secondary | ICD-10-CM | POA: Diagnosis not present

## 2020-06-20 DIAGNOSIS — E119 Type 2 diabetes mellitus without complications: Secondary | ICD-10-CM | POA: Diagnosis not present

## 2020-06-20 DIAGNOSIS — J45909 Unspecified asthma, uncomplicated: Secondary | ICD-10-CM | POA: Diagnosis not present

## 2020-06-20 DIAGNOSIS — Z79899 Other long term (current) drug therapy: Secondary | ICD-10-CM | POA: Insufficient documentation

## 2020-06-20 DIAGNOSIS — M79675 Pain in left toe(s): Secondary | ICD-10-CM | POA: Insufficient documentation

## 2020-06-20 DIAGNOSIS — W06XXXA Fall from bed, initial encounter: Secondary | ICD-10-CM | POA: Diagnosis not present

## 2020-06-20 DIAGNOSIS — Z794 Long term (current) use of insulin: Secondary | ICD-10-CM | POA: Insufficient documentation

## 2020-06-20 DIAGNOSIS — Z7902 Long term (current) use of antithrombotics/antiplatelets: Secondary | ICD-10-CM | POA: Diagnosis not present

## 2020-06-20 DIAGNOSIS — Z7984 Long term (current) use of oral hypoglycemic drugs: Secondary | ICD-10-CM | POA: Insufficient documentation

## 2020-06-20 DIAGNOSIS — R102 Pelvic and perineal pain: Secondary | ICD-10-CM | POA: Insufficient documentation

## 2020-06-20 DIAGNOSIS — R519 Headache, unspecified: Secondary | ICD-10-CM | POA: Diagnosis not present

## 2020-06-20 DIAGNOSIS — I10 Essential (primary) hypertension: Secondary | ICD-10-CM | POA: Insufficient documentation

## 2020-06-20 DIAGNOSIS — W19XXXA Unspecified fall, initial encounter: Secondary | ICD-10-CM

## 2020-06-20 NOTE — Discharge Instructions (Addendum)
You were seen in the emergency department today for your pain after your fall. Your physical exam and vital signs are very reassuring.  CT scan of your head did not reveal any broken bones or bleeding in your brain, this is very good news. Your x-rays additionally are very reassuring, there are no broken bones here to suspect you will be sore on that side for a few days, as you fell onto the left side from your bed. You may take Tylenol as needed at home for your pain.   You may follow-up with your primary care doctor. Return to the emergency department if you develop any sudden severe headache, nausea or vomiting that does not stop, blurry vision, double vision, or any other new severe symptoms.

## 2020-06-20 NOTE — ED Triage Notes (Signed)
Patient fell out of bed today, landing on hardwood floors. Patient c/o left wrist pain, left groin, and 2nd left toe. Patient denies hitting her head. Patient is on blood thinners.

## 2020-06-20 NOTE — ED Provider Notes (Signed)
[facility]-EMERGENCY DEPT Provider Note   CSN: [id number] Arrival date & time: 06/20/20  1020     History Chief Complaint  Patient presents with  . Fall    Theresa RidgelGale Allen is a 80 y.o. female who presents for evaluation after she fell out of her bed this morning around 5 AM.  Patient has history of being quite active in her dreams and states that she rolled out of her bed and landed on her left side on the hardwood floor.  She states that she woke up immediately and was not disoriented, was able to get herself up off the ground and back into bed.  She lives with her adult daughter, Theresa BallRobin, who is at the bedside in the ED.  She did not inform her daughter that she had fallen until around 8 AM this morning.  At this time she is endorsing mild headache, left wrist pain, left hip pain, left groin pain with movement, and pain in her left 2nd toe.  Patient is on anticoagulation with Plavix.  She denies any chest pain or shortness of breath, denies any palpitations.  Denies abdominal pain or urinary symptoms. She denies LOC, but endorses nausea after her fall as well as mild headache.  Denies vomiting, blurry vision, or double vision.  According the patient's daughter, Theresa BallRobin, patient is mentating normally for her.  She just moved in with her approximately 2 days ago.  I personally reviewed this patient's medical respiratory history of type 2 diabetes hypertension, hypercholesterolemia, asthma, and TIA in the past.  Additionally patient was recently admitted at [facility] for suicidal ideation and major depressive episode; patient states that her husband recently had an accident and she has been feeling quite overwhelmed caring for him.  She states she is feeling much better at this time and is grateful for the care she received during that admission.  HPI     Past Medical History:  Diagnosis Date  . Asthma   . Diabetes mellitus without complication (HCC)   . High  cholesterol   . Hypertension   . TIA (transient ischemic attack) 2017    Patient Active Problem List   Diagnosis Date Noted  . Depression, major, recurrent (HCC) 06/13/2020    Past Surgical History:  Procedure Laterality Date  . ABDOMINAL HYSTERECTOMY    . APPENDECTOMY    . CHOLECYSTECTOMY       OB History   No obstetric history on file.     History reviewed. No pertinent family history.  Social History   Tobacco Use  . Smoking status: Never Smoker  . Smokeless tobacco: Never Used  Vaping Use  . Vaping Use: Never used  Substance Use Topics  . Alcohol use: Never  . Drug use: Never    Home Medications Prior to Admission medications   Medication Sig Start Date End Date Taking? Authorizing Provider  acetaminophen (TYLENOL) 325 MG tablet Take 650 mg by mouth every 6 (six) hours as needed for mild pain, fever or headache.   Yes [provider]  amLODipine (NORVASC) 10 MG tablet Take 10 mg by mouth daily. 05/05/16  Yes [provider]  cetirizine (ZYRTEC) 10 MG tablet Take 10 mg by mouth daily.   Yes [provider]  Cholecalciferol 25 MCG (1000 UT) tablet Take 2,000 Units by mouth 2 (two) times daily.   Yes [provider]  clopidogrel (PLAVIX) 75 MG tablet Take 75 mg by mouth daily. 12/08/16  Yes [provider]  donepezil (ARICEPT) 5 MG tablet Take 5 mg by mouth daily. 12/08/16  Yes [provider]  glipiZIDE (GLUCOTROL XL) 10 MG 24 hr tablet Take 10 mg by mouth 2 (two) times daily. 09/16/16  Yes [provider]  Insulin Glargine (BASAGLAR KWIKPEN) 100 UNIT/ML Inject 18 Units into the skin daily. 04/03/20  Yes [provider]  PARoxetine (PAXIL) 30 MG tablet Take 30 mg by mouth daily. 11/10/16  Yes [provider]  pioglitazone (ACTOS) 30 MG tablet Take 15 mg by mouth daily. 12/22/17  Yes [provider]  simvastatin (ZOCOR) 20 MG tablet Take 20 mg by mouth daily at 6 PM. 09/16/16  Yes  [provider]  traZODone (DESYREL) 50 MG tablet Take 50 mg by mouth at bedtime. 06/18/20  Yes [provider]    Allergies    Metformin  Review of Systems   Review of Systems  Constitutional: Negative.   HENT: Negative.   Respiratory: Negative.   Cardiovascular: Negative.   Gastrointestinal: Negative.   Genitourinary: Negative.   Musculoskeletal: Positive for arthralgias and myalgias.  Skin: Negative.   Neurological: Positive for headaches. Negative for dizziness, tremors, weakness and light-headedness.  Psychiatric/Behavioral: Negative.     Physical Exam Updated Vital Signs BP (!) 142/57   Pulse 72   Temp 97.7 F (36.5 C) (Oral)   Resp 18   Ht 5\' 2"  (1.575 m)   Wt 84.8 kg   SpO2 98%   BMI 34.20 kg/m   Physical Exam Vitals and nursing note reviewed.  Constitutional:      General: She is awake.     Appearance: Normal appearance. She is obese. She is not ill-appearing.  HENT:     Head: Normocephalic and atraumatic.     Comments: No step offs or hematomas of the scalp, no abrasions or signs of trauma.    Right Ear: Hearing and tympanic membrane normal. No hemotympanum.     Left Ear: Hearing and tympanic membrane normal. No hemotympanum.     Nose: Nose normal.     Mouth/Throat:     Mouth: Mucous membranes are moist.     Pharynx: Oropharynx is clear. Uvula midline. No oropharyngeal exudate or posterior oropharyngeal erythema.  Eyes:     General: Lids are normal. Vision grossly intact.        Right eye: No discharge.        Left eye: No discharge.     Extraocular Movements: Extraocular movements intact.     Conjunctiva/sclera: Conjunctivae normal.     Pupils: Pupils are equal, round, and reactive to light.  Neck:     Trachea: Trachea and phonation normal.  Cardiovascular:     Rate and Rhythm: Normal rate and regular rhythm.     Pulses: Normal pulses.          Radial pulses are 2+ on the right side and 2+ on the left side.       Dorsalis pedis  pulses are 2+ on the right side and 2+ on the left side.     Heart sounds: Normal heart sounds. No murmur heard.   Pulmonary:     Effort: Pulmonary effort is normal. No respiratory distress.     Breath sounds: Normal breath sounds. No wheezing or rales.  Chest:     Chest wall: No deformity, swelling, tenderness, crepitus or edema.  Abdominal:     General: Bowel sounds are normal. There is no distension.     Palpations: Abdomen is soft.  Tenderness: There is no abdominal tenderness. There is no guarding or rebound.     Comments: No tenderness to palpation of the ribs bilaterally, no signs of trauma on the chest or abdomen.  Musculoskeletal:        General: No deformity.     Right shoulder: Normal.     Left shoulder: Normal.     Right upper arm: Normal.     Left upper arm: Normal.     Left elbow: Normal.     Right forearm: Normal.     Left forearm: Normal.     Right wrist: Normal.     Left wrist: Swelling and tenderness present. No snuff box tenderness or crepitus. Normal range of motion.     Right hand: Normal.     Left hand: Normal.     Cervical back: Normal range of motion and neck supple. Tenderness present. No signs of trauma, rigidity, spasms, bony tenderness or crepitus. Muscular tenderness present. No pain with movement or spinous process tenderness.     Thoracic back: Normal. No bony tenderness.     Lumbar back: Normal. No bony tenderness.     Right hip: Normal.     Left hip: Tenderness and bony tenderness present. No crepitus. Decreased range of motion.     Right upper leg: Normal.     Left upper leg: Normal.     Right knee: Normal.     Left knee: Normal.     Right lower leg: Normal. No edema.     Left lower leg: Normal. No edema.     Right ankle: Normal.     Right Achilles Tendon: Normal.     Left ankle: Normal.     Left Achilles Tendon: Normal.     Right foot: Normal.     Left foot: Normal capillary refill. Bony tenderness present. No tenderness or crepitus.        Feet:  Lymphadenopathy:     Cervical: No cervical adenopathy.  Skin:    General: Skin is warm and dry.     Capillary Refill: Capillary refill takes less than 2 seconds.  Neurological:     General: No focal deficit present.     Mental Status: She is alert and oriented to person, place, and time. Mental status is at baseline.     Cranial Nerves: Cranial nerves are intact.     Sensory: Sensation is intact.     Motor: Motor function is intact.  Psychiatric:        Mood and Affect: Mood normal.        Behavior: Behavior is cooperative.     ED Results / Procedures / Treatments   Labs (all labs ordered are listed, but only abnormal results are displayed) Labs Reviewed - No data to display  EKG None  Radiology DG Wrist Complete Left  Result Date: 06/20/2020 CLINICAL DATA:  Fall EXAM: LEFT WRIST - COMPLETE 3+ VIEW COMPARISON:  07/06/2018 FINDINGS: Degenerative changes at the 1st carpometacarpal joint. Mild joint space narrowing in the radiocarpal joint. No acute bony abnormality. Specifically, no fracture, subluxation, or dislocation. IMPRESSION: No acute bony abnormality. Electronically Signed   By: Charlett Nose M.D.   On: 06/20/2020 12:09   CT Head Wo Contrast  Result Date: 06/20/2020 CLINICAL DATA:  Head trauma, minor. Additional history provided: Fall out of bed landing on hardwood floor. EXAM: CT HEAD WITHOUT CONTRAST CT CERVICAL SPINE WITHOUT CONTRAST TECHNIQUE: Multidetector CT imaging of the head and cervical spine was performed following  the standard protocol without intravenous contrast. Multiplanar CT image reconstructions of the cervical spine were also generated. COMPARISON:  Head CT 07/06/2018. FINDINGS: CT HEAD FINDINGS Brain: Mild cerebral and cerebellar atrophy. Moderate ill-defined hypoattenuation within the cerebral white matter is nonspecific, but compatible with chronic small vessel ischemic disease. There is no acute intracranial hemorrhage. No demarcated cortical  infarct. No extra-axial fluid collection. No evidence of intracranial mass. No midline shift. Vascular: No hyperdense vessel.  Atherosclerotic calcifications Skull: Normal. Negative for fracture or focal suspicious osseous lesion. Sinuses/Orbits: Visualized orbits show no acute finding. Trace ethmoid sinus mucosal thickening. CT CERVICAL SPINE FINDINGS Alignment: Cervicothoracic levocurvature. Reversal of the expected cervical lordosis. Trace C2-C3 grade 1 anterolisthesis. Skull base and vertebrae: The basion-dental and atlanto-dental intervals are maintained.No evidence of acute fracture to the cervical spine. Soft tissues and spinal canal: No prevertebral fluid or swelling. No visible canal hematoma. Disc levels: Advanced cervical spondylosis with multilevel disc space narrowing, disc bulges, posterior disc osteophytes, uncovertebral hypertrophy and facet arthrosis. Disc space narrowing is moderate/severe at C4-C5, C5-C6 and C6-C7. Multilevel spinal canal and neural foraminal narrowing. Most notably, posterior disc osteophytes contributes to at least moderate spinal canal stenosis at C4-C5, C5-C6 and C6-C7. Bilateral facet joint ankylosis at C3-C4. prominent multilevel ventral osteophytes most notably at C4-C5 and C5-C6. Upper chest: No consolidation within the imaged lung apices. No visible pneumothorax. IMPRESSION: CT head: 1. No evidence of acute intracranial abnormality. 2. Mild generalized parenchymal atrophy with moderate chronic small vessel ischemic disease. CT cervical spine: 1. No evidence of acute fracture to the cervical spine. 2. Cervicothoracic levocurvature. 3. Nonspecific reversal of the expected cervical lordosis. 4. Mild C2-C3 grade 1 anterolisthesis. 5. Advanced cervical spondylosis as described. Notably, posterior disc osteophytes contribute to at least moderate spinal canal stenosis at C4-C5, C5-C6 and C6-C7. Electronically Signed   By: Jackey Loge DO   On: 06/20/2020 11:59   CT Cervical  Spine Wo Contrast  Result Date: 06/20/2020 CLINICAL DATA:  Head trauma, minor. Additional history provided: Fall out of bed landing on hardwood floor. EXAM: CT HEAD WITHOUT CONTRAST CT CERVICAL SPINE WITHOUT CONTRAST TECHNIQUE: Multidetector CT imaging of the head and cervical spine was performed following the standard protocol without intravenous contrast. Multiplanar CT image reconstructions of the cervical spine were also generated. COMPARISON:  Head CT 07/06/2018. FINDINGS: CT HEAD FINDINGS Brain: Mild cerebral and cerebellar atrophy. Moderate ill-defined hypoattenuation within the cerebral white matter is nonspecific, but compatible with chronic small vessel ischemic disease. There is no acute intracranial hemorrhage. No demarcated cortical infarct. No extra-axial fluid collection. No evidence of intracranial mass. No midline shift. Vascular: No hyperdense vessel.  Atherosclerotic calcifications Skull: Normal. Negative for fracture or focal suspicious osseous lesion. Sinuses/Orbits: Visualized orbits show no acute finding. Trace ethmoid sinus mucosal thickening. CT CERVICAL SPINE FINDINGS Alignment: Cervicothoracic levocurvature. Reversal of the expected cervical lordosis. Trace C2-C3 grade 1 anterolisthesis. Skull base and vertebrae: The basion-dental and atlanto-dental intervals are maintained.No evidence of acute fracture to the cervical spine. Soft tissues and spinal canal: No prevertebral fluid or swelling. No visible canal hematoma. Disc levels: Advanced cervical spondylosis with multilevel disc space narrowing, disc bulges, posterior disc osteophytes, uncovertebral hypertrophy and facet arthrosis. Disc space narrowing is moderate/severe at C4-C5, C5-C6 and C6-C7. Multilevel spinal canal and neural foraminal narrowing. Most notably, posterior disc osteophytes contributes to at least moderate spinal canal stenosis at C4-C5, C5-C6 and C6-C7. Bilateral facet joint ankylosis at C3-C4. prominent multilevel  ventral osteophytes most notably at C4-C5 and  C5-C6. Upper chest: No consolidation within the imaged lung apices. No visible pneumothorax. IMPRESSION: CT head: 1. No evidence of acute intracranial abnormality. 2. Mild generalized parenchymal atrophy with moderate chronic small vessel ischemic disease. CT cervical spine: 1. No evidence of acute fracture to the cervical spine. 2. Cervicothoracic levocurvature. 3. Nonspecific reversal of the expected cervical lordosis. 4. Mild C2-C3 grade 1 anterolisthesis. 5. Advanced cervical spondylosis as described. Notably, posterior disc osteophytes contribute to at least moderate spinal canal stenosis at C4-C5, C5-C6 and C6-C7. Electronically Signed   By: Jackey Loge DO   On: 06/20/2020 11:59   DG Foot Complete Left  Result Date: 06/20/2020 CLINICAL DATA:  Fall, foot pain EXAM: LEFT FOOT - COMPLETE 3+ VIEW COMPARISON:  None. FINDINGS: No acute bony abnormality. Specifically, no fracture, subluxation, or dislocation. Early joint space narrowing in the 1st MTP joint. Soft tissues are intact. IMPRESSION: No acute bony abnormality. Electronically Signed   By: Charlett Nose M.D.   On: 06/20/2020 12:11   DG Hip Unilat W or Wo Pelvis 2-3 Views Left  Result Date: 06/20/2020 CLINICAL DATA:  Fall, left hip pain EXAM: DG HIP (WITH OR WITHOUT PELVIS) 2-3V LEFT COMPARISON:  None. FINDINGS: Early spurring in the hip joints bilaterally. Joint spaces maintained. SI joints symmetric and unremarkable. No acute bony abnormality. Specifically, no fracture, subluxation, or dislocation. IMPRESSION: No acute bony abnormality. Electronically Signed   By: Charlett Nose M.D.   On: 06/20/2020 12:10    Procedures Procedures   Medications Ordered in ED Medications - No data to display  ED Course  I have reviewed the triage vital signs and the nursing notes.  Pertinent labs & imaging results that were available during my care of the patient were reviewed by me and considered in my medical  decision making (see chart for details).    MDM Rules/Calculators/A&P                         80 year old female who fell out of bed this morning and presents with complaint of left wrist, hip, and foot pain.  She is anticoagulated on Plavix.  Hypertensive on intake, vital signs otherwise normal.  Cardiopulmonary exam is normal, abdominal exam is benign.  Patient is neurologically intact without focal deficit on her neurologic exam.  MSK exam revealed mild swelling and tenderness palpation of the left wrist, tenderness palpation over the left hip with bony tenderness palpation, and tenderness to palpation over the left 2nd toe without signs of trauma.  Her head is atraumatic, however given nausea and headache following fall on blood thinners we will proceed with CT of head at this time.  We will also proceed with plain films of the wrist, hand, forearm left.  Analgesia offered, patient declined.  CT head and cervical spine negative for acute fracture or intracranial hemorrhage. Plain films of the wrist, hip and pelvis, and foot are negative for acute fractures or dislocations.   Given reassuring physical exam, vital signs, and imaging studies, no further work-up is warranted in the ED at this time. Suspect patient is sore secondary to impact from her fall from the bed; she may utilize Tylenol as needed at home for pain. She may follow-up with her primary care doctor.  You voiced understanding for medical evaluation treatment plan. Each of her questions was answered to her expressed inspection. Return precautions were given. Patient is well-appearing, stable, and appropriate for discharge at this time.  This chart was dictated  using voice recognition software, Dragon. Despite the best efforts of this provider to proofread and correct errors, errors may still occur which can change documentation meaning.  Final Clinical Impression(s) / ED Diagnoses Final diagnoses:  Fall, initial encounter    Rx  / DC Orders ED Discharge Orders    None       Sherrilee Gilles 06/20/20 1340    Cathren Laine, MD 06/21/20 1324

## 2020-11-21 DIAGNOSIS — E1159 Type 2 diabetes mellitus with other circulatory complications: Secondary | ICD-10-CM | POA: Insufficient documentation

## 2021-01-07 DIAGNOSIS — E669 Obesity, unspecified: Secondary | ICD-10-CM | POA: Insufficient documentation

## 2021-12-29 IMAGING — CR DG FOOT COMPLETE 3+V*L*
3 series · 3 of 3 positions shown · non-contrast
Comparison: None.

CLINICAL DATA: Fall, foot pain

EXAM:
LEFT FOOT - COMPLETE 3+ VIEW

[x foot ap left]
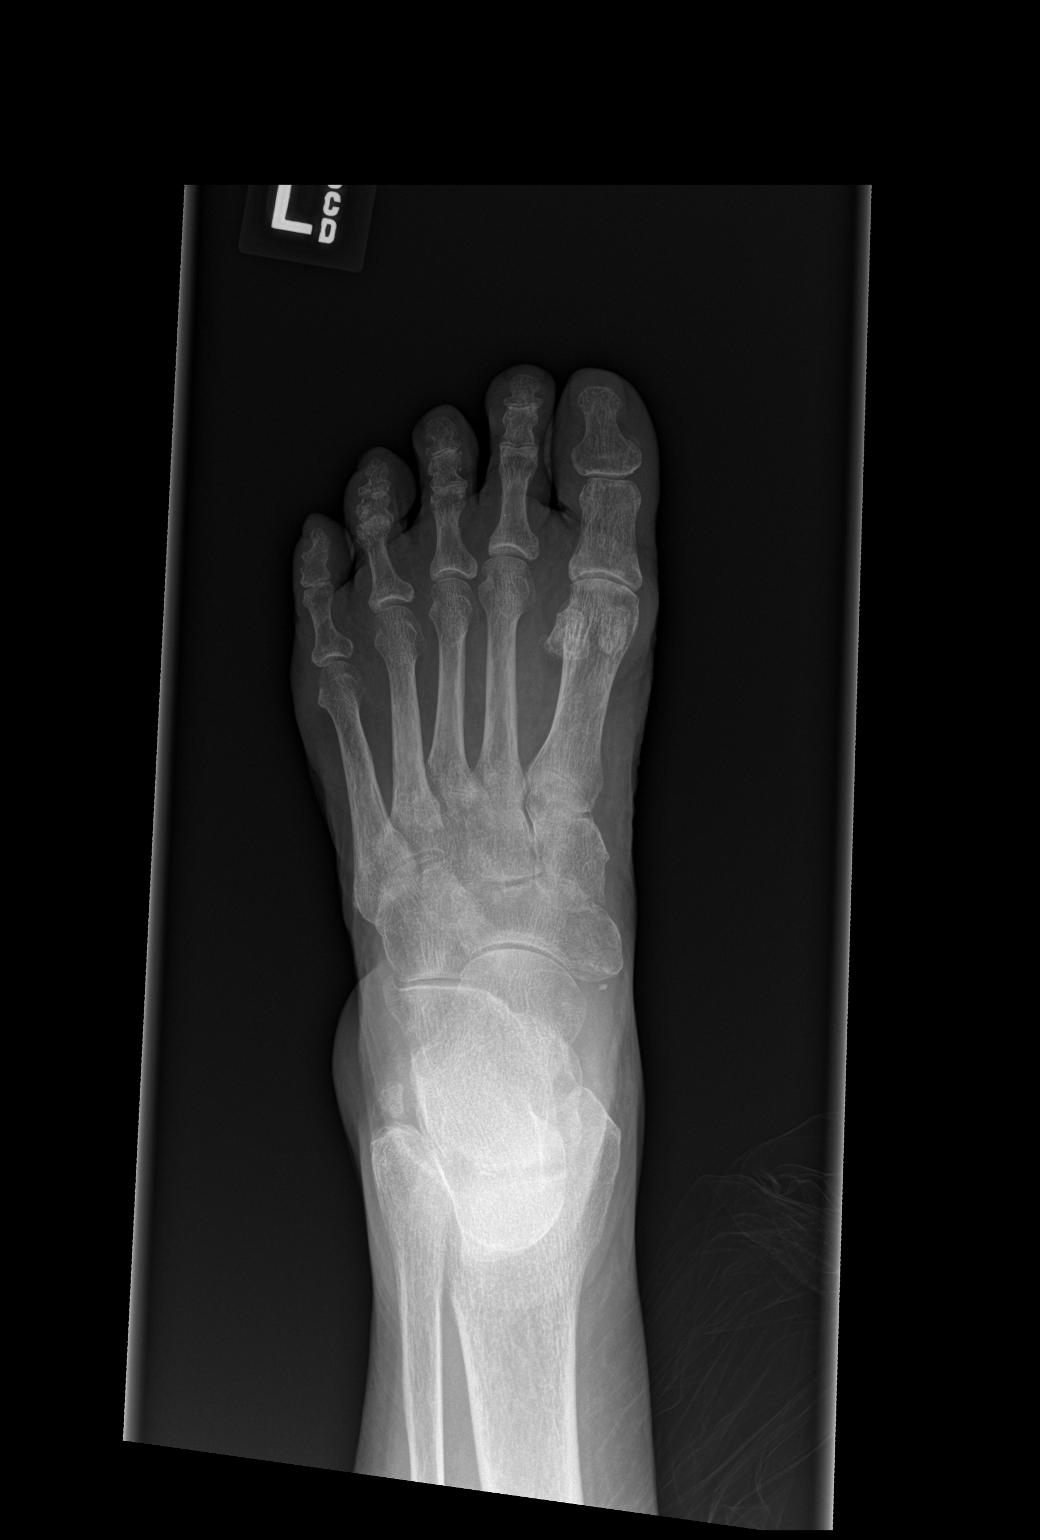

[x foot obl left]
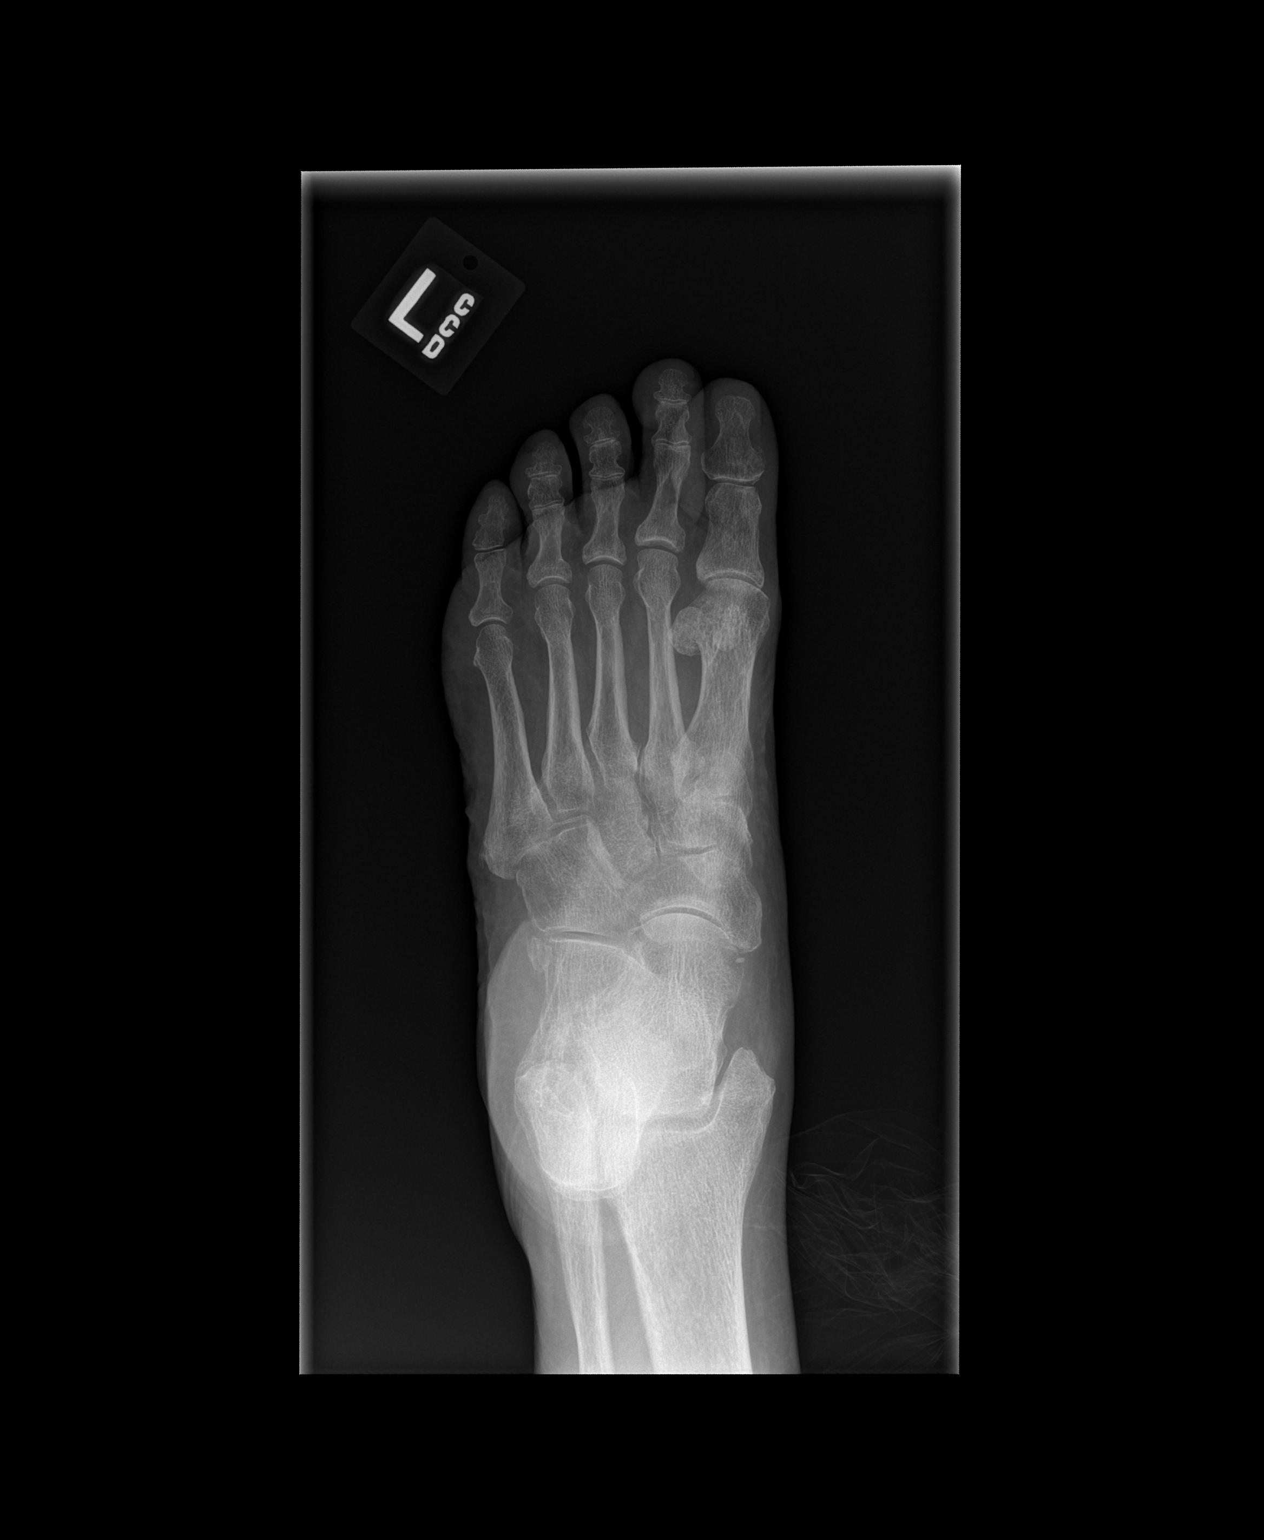

[x foot lat left]
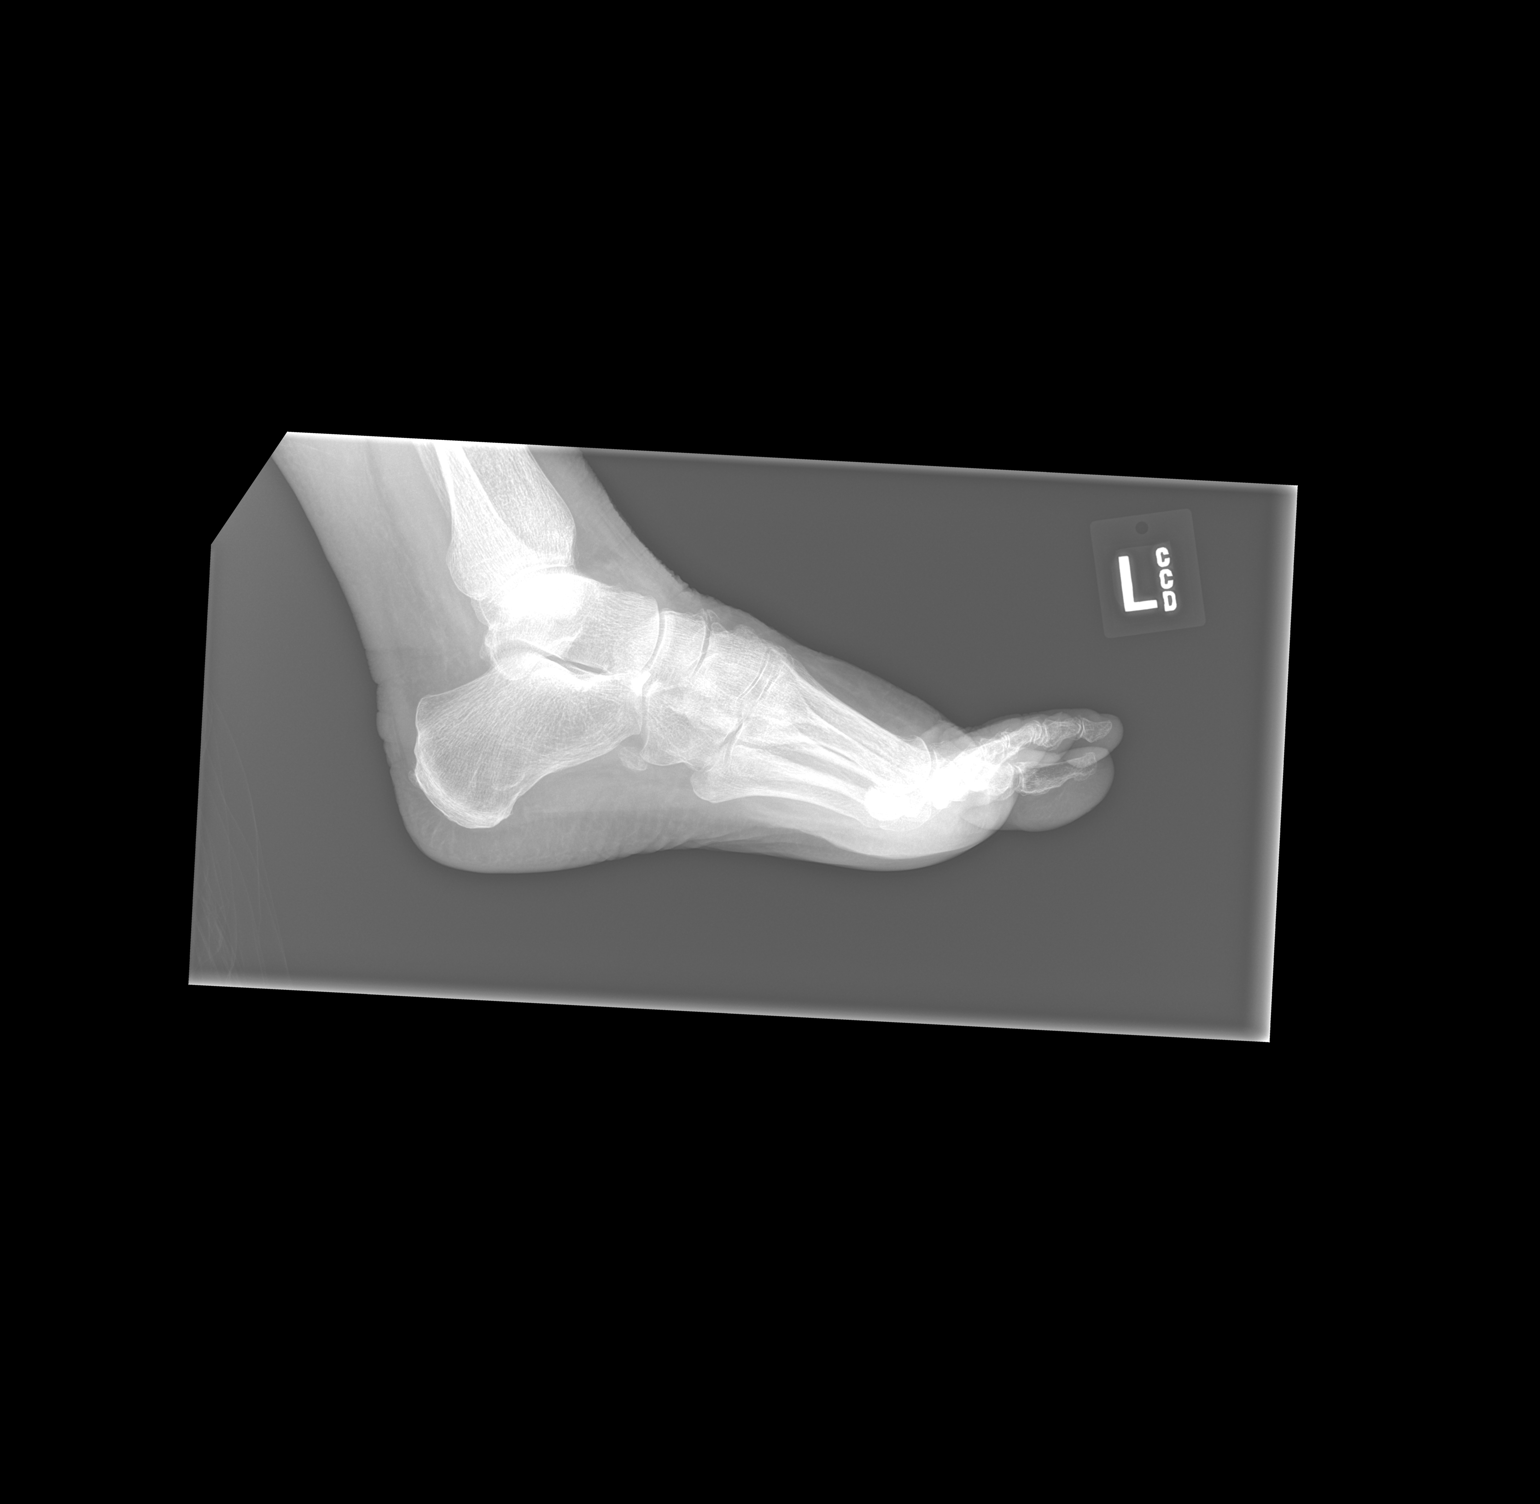

[3 of 3 positions shown; findings below may reference images not displayed]

FINDINGS: No acute bony abnormality. Specifically, no fracture, subluxation,
or dislocation. Early joint space narrowing in the 1st MTP joint.
Soft tissues are intact.
IMPRESSION: No acute bony abnormality.

## 2021-12-29 IMAGING — CR DG HIP (WITH OR WITHOUT PELVIS) 2-3V*L*
3 series · 3 of 3 positions shown · non-contrast
Comparison: None.

CLINICAL DATA: Fall, left hip pain

EXAM:
DG HIP (WITH OR WITHOUT PELVIS) 2-3V LEFT

[t pelvis ap]
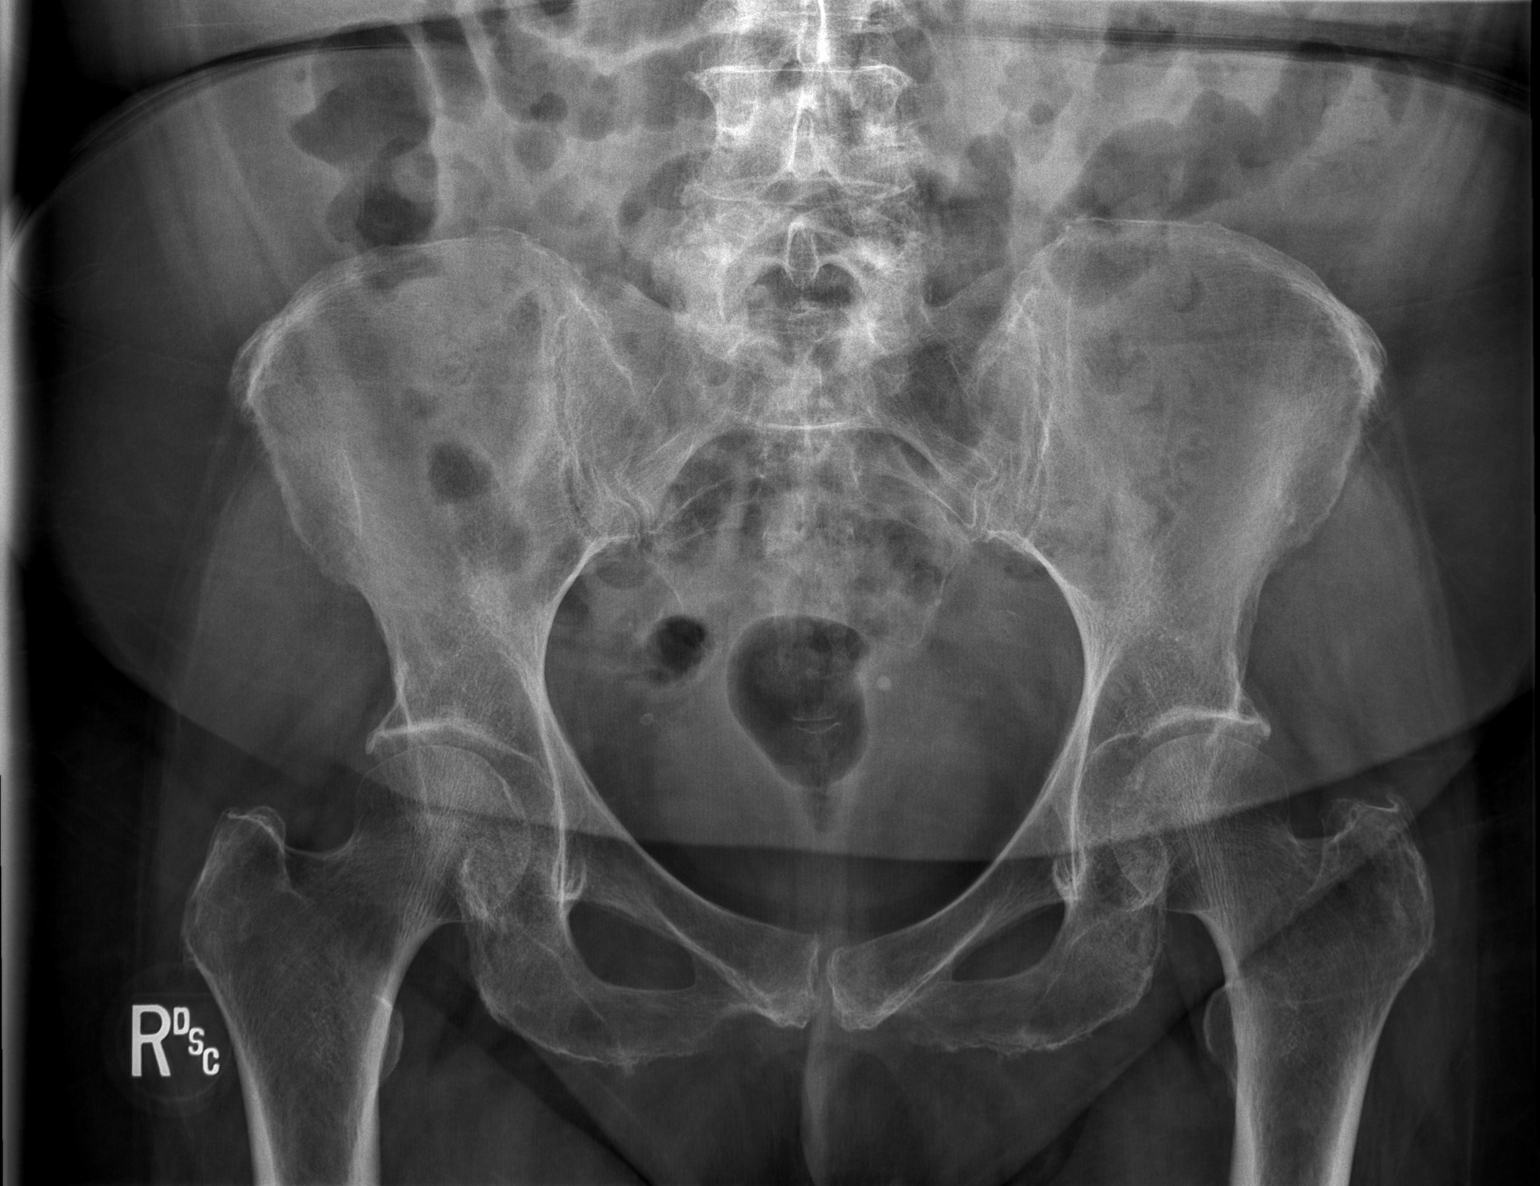

[t hip ap left]
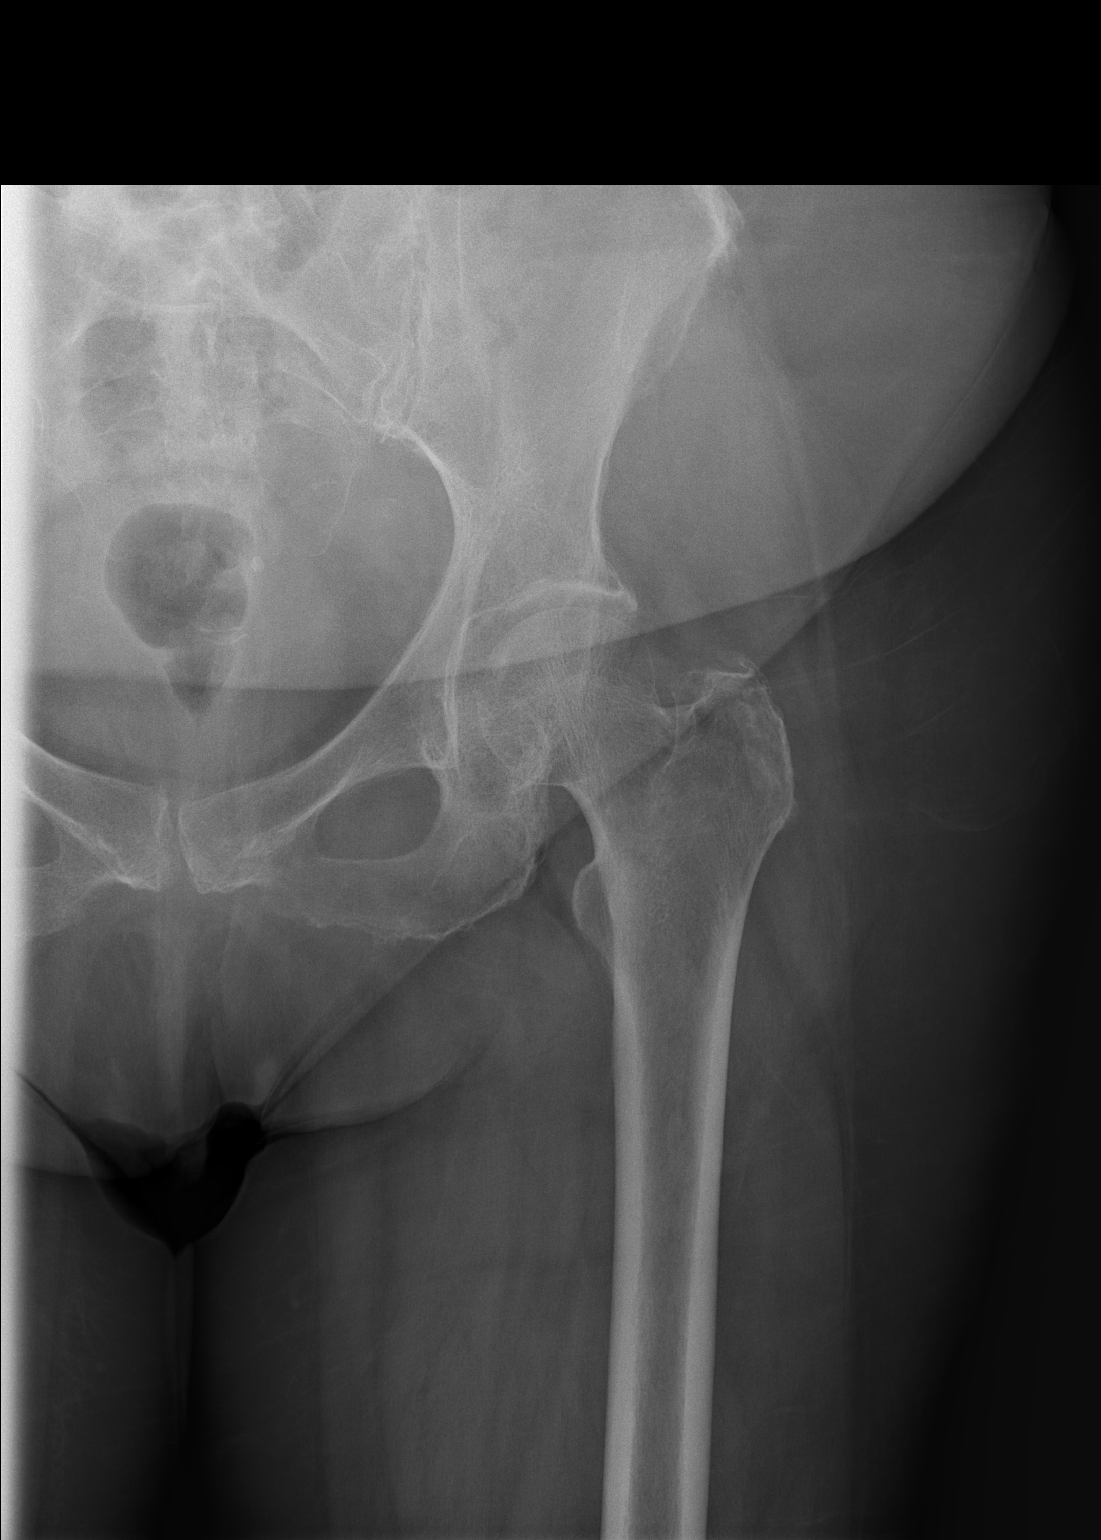

[t hip frog leg left]
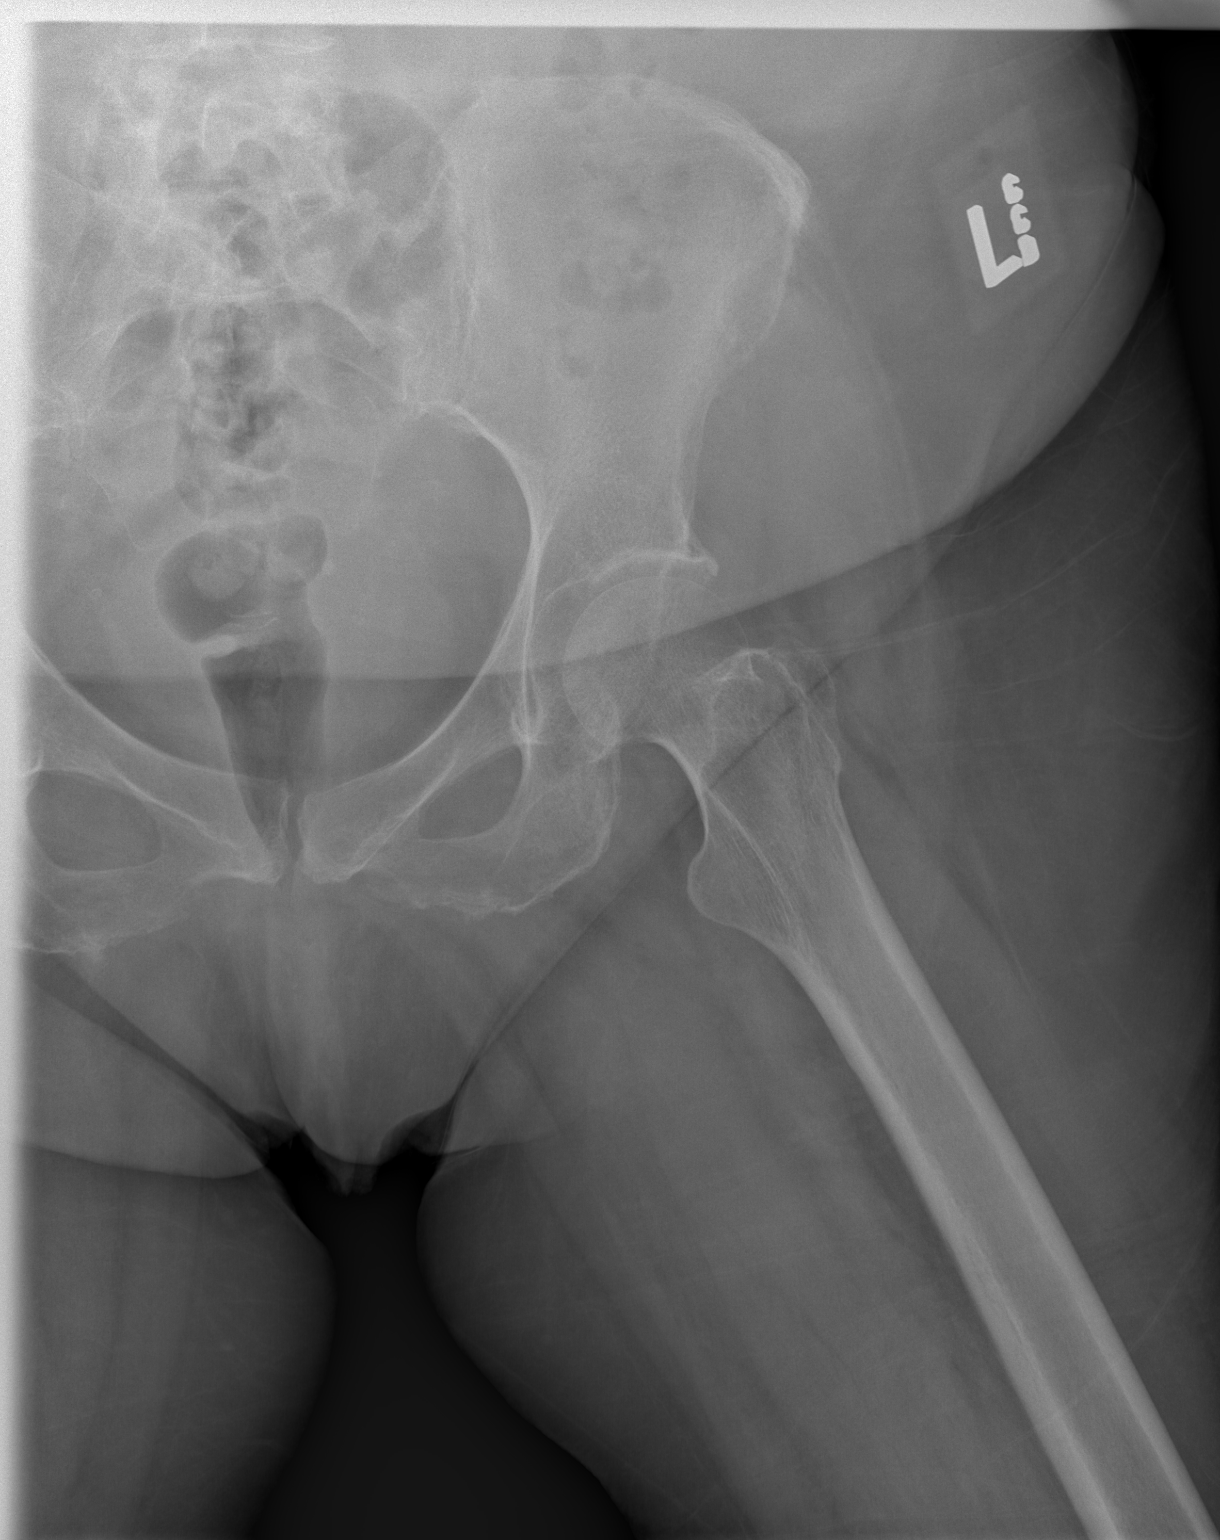

[3 of 3 positions shown; findings below may reference images not displayed]

FINDINGS: Early spurring in the hip joints bilaterally. Joint spaces
maintained. SI joints symmetric and unremarkable. No acute bony
abnormality. Specifically, no fracture, subluxation, or dislocation.
IMPRESSION: No acute bony abnormality.

## 2023-01-04 ENCOUNTER — Other Ambulatory Visit (HOSPITAL_BASED_OUTPATIENT_CLINIC_OR_DEPARTMENT_OTHER): Payer: Self-pay

## 2023-01-04 MED ORDER — COVID-19 MRNA VAC-TRIS(PFIZER) 30 MCG/0.3ML IM SUSY
0.3000 mL | PREFILLED_SYRINGE | Freq: Once | INTRAMUSCULAR | 0 refills | Status: AC
  Filled 2023-01-04: qty 0.3, 1d supply, fill #0

## 2023-02-22 ENCOUNTER — Encounter (HOSPITAL_COMMUNITY): Payer: Self-pay

## 2024-01-27 ENCOUNTER — Other Ambulatory Visit: Payer: Self-pay

## 2024-01-27 ENCOUNTER — Telehealth: Payer: Self-pay

## 2024-01-27 ENCOUNTER — Other Ambulatory Visit (HOSPITAL_BASED_OUTPATIENT_CLINIC_OR_DEPARTMENT_OTHER): Payer: Self-pay

## 2024-01-27 DIAGNOSIS — Z0000 Encounter for general adult medical examination without abnormal findings: Secondary | ICD-10-CM

## 2024-01-27 DIAGNOSIS — N183 Chronic kidney disease, stage 3 unspecified: Secondary | ICD-10-CM

## 2024-01-27 DIAGNOSIS — Z8673 Personal history of transient ischemic attack (TIA), and cerebral infarction without residual deficits: Secondary | ICD-10-CM | POA: Insufficient documentation

## 2024-01-27 DIAGNOSIS — E1129 Type 2 diabetes mellitus with other diabetic kidney complication: Secondary | ICD-10-CM | POA: Insufficient documentation

## 2024-01-27 DIAGNOSIS — E876 Hypokalemia: Secondary | ICD-10-CM | POA: Insufficient documentation

## 2024-01-27 DIAGNOSIS — I10 Essential (primary) hypertension: Secondary | ICD-10-CM

## 2024-01-27 DIAGNOSIS — E11A Type 2 diabetes mellitus without complications in remission: Secondary | ICD-10-CM

## 2024-01-27 DIAGNOSIS — E7800 Pure hypercholesterolemia, unspecified: Secondary | ICD-10-CM

## 2024-01-27 MED ORDER — COVID-19 MRNA VACC (MODERNA) 50 MCG/0.5ML IM SUSY: 0.5000 mL | PREFILLED_SYRINGE | Freq: Once | INTRAMUSCULAR | 0 refills | Status: DC

## 2024-01-27 MED ORDER — COMIRNATY 30 MCG/0.3ML IM SUSY
0.3000 mL | PREFILLED_SYRINGE | Freq: Once | INTRAMUSCULAR | 0 refills | Status: AC
Start: 2024-01-27 — End: 2024-01-28
  Filled 2024-01-27: qty 0.3, 1d supply, fill #0

## 2024-01-27 MED ORDER — COVID-19 MRNA VACC (MODERNA) 50 MCG/0.5ML IM SUSY
0.5000 mL | PREFILLED_SYRINGE | Freq: Once | INTRAMUSCULAR | 0 refills | Status: AC
  Filled 2024-01-27: qty 0.5, 1d supply, fill #0

## 2024-01-27 MED ORDER — FLUZONE HIGH-DOSE 0.5 ML IM SUSY
0.5000 mL | PREFILLED_SYRINGE | Freq: Once | INTRAMUSCULAR | 0 refills | Status: AC
  Filled 2024-01-27: qty 0.5, 1d supply, fill #0

## 2024-01-27 NOTE — Telephone Encounter (Signed)
 Copied from CRM #[id number]. Topic: Clinical - Medication Question >> Jan 27, 2024 11:35 AM Suzen RAMAN wrote: Reason for CRM:  patient would like a prescription for the  Covid Vaccine sent to  [facility]

## 2024-05-11 ENCOUNTER — Encounter (HOSPITAL_BASED_OUTPATIENT_CLINIC_OR_DEPARTMENT_OTHER): Payer: Self-pay

## 2024-05-11 ENCOUNTER — Emergency Department (HOSPITAL_BASED_OUTPATIENT_CLINIC_OR_DEPARTMENT_OTHER)
Admission: EM | Admit: 2024-05-11 | Discharge: 2024-05-11 | Disposition: A | Discharge status: Home / Self Care | Attending: Emergency Medicine | Admitting: Emergency Medicine

## 2024-05-11 ENCOUNTER — Emergency Department (HOSPITAL_BASED_OUTPATIENT_CLINIC_OR_DEPARTMENT_OTHER)

## 2024-05-11 ENCOUNTER — Other Ambulatory Visit: Payer: Self-pay

## 2024-05-11 DIAGNOSIS — W010XXA Fall on same level from slipping, tripping and stumbling without subsequent striking against object, initial encounter: Secondary | ICD-10-CM | POA: Diagnosis not present

## 2024-05-11 DIAGNOSIS — Z794 Long term (current) use of insulin: Secondary | ICD-10-CM | POA: Diagnosis not present

## 2024-05-11 DIAGNOSIS — Z7902 Long term (current) use of antithrombotics/antiplatelets: Secondary | ICD-10-CM | POA: Insufficient documentation

## 2024-05-11 DIAGNOSIS — Z79899 Other long term (current) drug therapy: Secondary | ICD-10-CM | POA: Diagnosis not present

## 2024-05-11 DIAGNOSIS — S79921A Unspecified injury of right thigh, initial encounter: Secondary | ICD-10-CM | POA: Diagnosis present

## 2024-05-11 DIAGNOSIS — Z7984 Long term (current) use of oral hypoglycemic drugs: Secondary | ICD-10-CM | POA: Insufficient documentation

## 2024-05-11 DIAGNOSIS — S70311A Abrasion, right thigh, initial encounter: Secondary | ICD-10-CM | POA: Diagnosis not present

## 2024-05-11 DIAGNOSIS — W19XXXA Unspecified fall, initial encounter: Secondary | ICD-10-CM

## 2024-05-11 MED ORDER — LIDOCAINE 5 % EX PTCH
1.0000 | MEDICATED_PATCH | CUTANEOUS | Status: DC
  Administered 2024-05-11: 1 via TRANSDERMAL
  Filled 2024-05-11: qty 1

## 2024-05-11 MED ORDER — LIDOCAINE 5 % EX PTCH: 1.0000 | MEDICATED_PATCH | CUTANEOUS | 0 refills | Status: AC

## 2024-05-11 NOTE — ED Notes (Signed)
 Reviewed discharge instructions, follow up and medications with pt. Pt pain has resolved. Ambulatory with cane at discharge. Accompanied by family

## 2024-05-11 NOTE — ED Provider Notes (Signed)
 " [facility] Provider Note   CSN: [id number] Arrival date & time: 05/11/24  1432    Patient presents with: Theresa Allen is a 84 y.o. female here for evaluation of fall.  2 days ago patient was walking with groceries in her hand when she tripped and fell.  Landed on her right hip.  She denies hitting her head, LOC.  She states she typically uses a cane to a walker was not able to walk with this when she was carrying her groceries in.  Neighbor had to help her up however she was able to ambulate afterwards.  She still has some pain to her right hip.  No chest pain, abdominal pain, nausea, vomiting, numbness, weakness, new incontinence.  No midline back pain.  Taking Tylenol  at home.  Nontender bilateral upper extremities, left lower extremity. Tetanus up to date. Abrasions to right distal femur.  Patient states she is on Eliquis.  I reviewed her cardiology note from 04/26/2024 no Eliquis mention however she is on Plavix    HPI     Prior to Admission medications  Medication Sig Start Date End Date Taking? Authorizing Provider  lidocaine  (LIDODERM ) 5 % Place 1 patch onto the skin daily. Remove & Discard patch within 12 hours or as directed by MD 05/11/24  Yes Chardae Mulkern A, PA-C  acetaminophen  (TYLENOL ) 325 MG tablet Take 650 mg by mouth every 6 (six) hours as needed for mild pain, fever or headache.    [provider]  amLODipine  (NORVASC ) 10 MG tablet Take 10 mg by mouth daily. 05/05/16   [provider]  cetirizine (ZYRTEC) 10 MG tablet Take 10 mg by mouth daily.    [provider]  Cholecalciferol 25 MCG (1000 UT) tablet Take 2,000 Units by mouth 2 (two) times daily.    [provider]  clopidogrel  (PLAVIX ) 75 MG tablet Take 75 mg by mouth daily. 12/08/16   [provider]  donepezil (ARICEPT) 5 MG tablet Take 5 mg by mouth daily. 12/08/16   [provider]  glipiZIDE  (GLUCOTROL  XL)  10 MG 24 hr tablet Take 10 mg by mouth 2 (two) times daily. 09/16/16   [provider]  Insulin Glargine (BASAGLAR KWIKPEN) 100 UNIT/ML Inject 18 Units into the skin daily. 04/03/20   [provider]  PARoxetine  (PAXIL ) 30 MG tablet Take 30 mg by mouth daily. 11/10/16   [provider]  pioglitazone (ACTOS) 30 MG tablet Take 15 mg by mouth daily. 12/22/17   [provider]  simvastatin  (ZOCOR ) 20 MG tablet Take 20 mg by mouth daily at 6 PM. 09/16/16   [provider]  traZODone  (DESYREL ) 50 MG tablet Take 50 mg by mouth at bedtime. 06/18/20   [provider]    Allergies: Metformin    Review of Systems  Constitutional: Negative.   HENT: Negative.    Respiratory: Negative.    Cardiovascular: Negative.   Gastrointestinal: Negative.   Genitourinary: Negative.   Musculoskeletal:        Right hip/ femur pain  Skin: Negative.   Neurological: Negative.   All other systems reviewed and are negative.   Updated Vital Signs BP (!) 153/81 (BP Location: Right Arm)   Pulse 83   Temp 97.8 F (36.6 C) (Oral)   Resp 20   SpO2 95%   Physical Exam Vitals and nursing note reviewed.  Constitutional:      General: She is not in  acute distress.    Appearance: She is well-developed. She is not ill-appearing, toxic-appearing or diaphoretic.  HENT:     Head: Normocephalic and atraumatic.     Comments: No raccoon eyes, Battle sign, hemotympanums    Nose: Nose normal.     Mouth/Throat:     Mouth: Mucous membranes are moist.     Comments: No loose dentition Eyes:     Pupils: Pupils are equal, round, and reactive to light.  Neck:     Comments: No midline spinal tenderness, full range of motion Cardiovascular:     Rate and Rhythm: Normal rate.     Pulses: Normal pulses.          Radial pulses are 2+ on the right side and 2+ on the left side.       Dorsalis pedis pulses are 2+ on the right side and 2+ on the left side.     Heart sounds: Normal heart  sounds.  Pulmonary:     Effort: Pulmonary effort is normal. No respiratory distress.     Breath sounds: Normal breath sounds.     Comments: Clear bilaterally, speaks in full sentences without difficulty Abdominal:     General: Bowel sounds are normal. There is no distension.     Palpations: Abdomen is soft.     Tenderness: There is no abdominal tenderness. There is no right CVA tenderness, left CVA tenderness or guarding.     Comments: Soft, nontender, no rebound or guarding.  No overlying skin changes  Musculoskeletal:        General: Normal range of motion.     Cervical back: Normal range of motion.     Comments: No midline C/T/L tenderness.  Nontender bilateral upper extremities.  Lifts arms overhead without difficulty.  Nontender left lower extremity.  This bilateral legs off bed without difficulty.  Diffuse tenderness to right lateral hip, mid femur, midshaft tib-fib.  Able to flex and extend at right hip, knee.  Nontender foot.  Able to plantarflex and dorsiflex.  Skin:    General: Skin is warm and dry.     Capillary Refill: Capillary refill takes less than 2 seconds.     Comments: Scabbed over skin abrasions right distal medial aspect femur.   Neurological:     General: No focal deficit present.     Mental Status: She is alert.     Cranial Nerves: No cranial nerve deficit.     Sensory: No sensory deficit.     Motor: No weakness.     Gait: Gait normal.  Psychiatric:        Mood and Affect: Mood normal.     (all labs ordered are listed, but only abnormal results are displayed) Labs Reviewed - No data to display  EKG: None  Radiology: DG Tibia/Fibula Right Result Date: 05/11/2024 CLINICAL DATA:  Fall with right lower leg pain. EXAM: RIGHT TIBIA AND FIBULA - 2 VIEW COMPARISON:  None Available. FINDINGS: There is no evidence of fracture or other focal bone lesions. Soft tissues are unremarkable. IMPRESSION: Negative. Electronically Signed   By: Toribio Agreste M.D.   On:  05/11/2024 15:55   DG Femur Min 2 Views Right Result Date: 05/11/2024 EXAM: 2 VIEW(S) XRAY OF THE RIGHT FEMUR 05/11/2024 03:46:47 PM COMPARISON: None available. CLINICAL HISTORY: Patient fell. FINDINGS: BONES AND JOINTS: Mild degenerative changes of the right hip. No acute fracture. No malalignment. SOFT TISSUES: Vascular calcifications. IMPRESSION: 1. No acute fracture or dislocation. Electronically signed by: Greig Pique  MD 05/11/2024 03:54 PM EST RP Workstation: [id number]   DG Pelvis 1-2 Views Result Date: 05/11/2024 EXAM: 1 or 2 VIEW(S) XRAY OF THE PELVIS 05/11/2024 03:46:47 PM COMPARISON: None available. CLINICAL HISTORY: Patient fell. FINDINGS: BONES AND JOINTS: No acute fracture. No malalignment. Mild degenerative changes in the lower lumbar spine and both hips. SOFT TISSUES: Unremarkable. IMPRESSION: 1. No acute traumatic injury identified. 2. Mild degenerative changes in the lower lumbar spine and both hips. Electronically signed by: Greig Pique MD 05/11/2024 03:52 PM EST RP Workstation: [id number]     Procedures   Medications Ordered in the ED  lidocaine  (LIDODERM ) 5 % 1 patch (has no administration in time range)    84 year old here for evaluation with daughter at bedside after mechanical fall 2 days ago.  Denies hitting head, LOC.  Ambulatory after the incident however she has had persistent pain to her right hip, distal femur and midshaft tib-fib.  No midline spinal tenderness.  She is neurologically intact.  Nontender chest, abdomen without overlying skin changes.  Does have some healed over scant abrasions to her distal medial aspect femur.  Will plan on imaging, reassess she does not want anything for pain at this time  Imaging personally viewed interpreted X-ray pelvis without acute X-ray femur without acute X-ray tibfib without acute  Patient reassesed.  Ambulatory here.  Neurovascularly intact.  Discussed labs and imaging.  Low suspicion for occult fracture, dislocation,  acute spinal abnormality, intra thoracic, intra-abdominal, intracranial traumatic injury.  Will have her follow-up outpatient, return for new or worsening symptoms  The patient has been appropriately medically screened and/or stabilized in the ED. I have low suspicion for any other emergent medical condition which would require further screening, evaluation or treatment in the ED or require inpatient management.  Patient is hemodynamically stable and in no acute distress.  Patient able to ambulate in department prior to ED.  Evaluation does not show acute pathology that would require ongoing or additional emergent interventions while in the emergency department or further inpatient treatment.  I have discussed the diagnosis with the patient and answered all questions.  Pain is been managed while in the emergency department and patient has no further complaints prior to discharge.  Patient is comfortable with plan discussed in room and is stable for discharge at this time.  I have discussed strict return precautions for returning to the emergency department.  Patient was encouraged to follow-up with PCP/specialist refer to at discharge.                                   Medical Decision Making Amount and/or Complexity of Data Reviewed Independent Historian:     Details: Family at bedside External Data Reviewed: labs, radiology and notes. Radiology: ordered and independent interpretation performed. Decision-making details documented in ED Course.  Risk OTC drugs. Prescription drug management. Decision regarding hospitalization. Diagnosis or treatment significantly limited by social determinants of health.        Final diagnoses:  Fall, initial encounter    ED Discharge Orders          Ordered    lidocaine  (LIDODERM ) 5 %  Every 24 hours        05/11/24 1643               Fenna Semel A, PA-C 05/11/24 1645    Curat[place], Adam, DO 05/13/24 1119  "

## 2024-05-11 NOTE — ED Notes (Signed)
 Pt still in restroom

## 2024-05-11 NOTE — ED Triage Notes (Signed)
 Fell on concrete. Lost her footing from neuropathy. R hip pain since. Ambulatory No head injury, LOC.   On eliquis

## 2024-05-11 NOTE — Discharge Instructions (Signed)
 It was a pleasure taking care of you here today.  I have prescribed you lidocaine  patches.  Please note sometimes this medication can be expensive even with insurance coverage.  May also get Salonpas with 3% lidocaine  which are similar which are over-the-counter.  Continue to take Tylenol  ibuprofen at home.  Follow-up outpatient, return for new or worsening symptoms
